# Patient Record
Sex: Female | Born: 1940 | Race: Black or African American | Hispanic: No | Marital: Married | Smoking: Current every day smoker
Health system: Southern US, Community
[De-identification: ages and names within clinical notes are randomized; demographics above are authoritative.]

---

## 2013-04-23 DIAGNOSIS — I10 Essential (primary) hypertension: Secondary | ICD-10-CM | POA: Diagnosis present

## 2013-10-05 DIAGNOSIS — E790 Hyperuricemia without signs of inflammatory arthritis and tophaceous disease: Secondary | ICD-10-CM | POA: Insufficient documentation

## 2013-10-05 DIAGNOSIS — D61818 Other pancytopenia: Secondary | ICD-10-CM | POA: Insufficient documentation

## 2018-12-04 ENCOUNTER — Other Ambulatory Visit: Payer: Self-pay

## 2018-12-04 ENCOUNTER — Emergency Department (HOSPITAL_BASED_OUTPATIENT_CLINIC_OR_DEPARTMENT_OTHER)
Admission: EM | Admit: 2018-12-04 | Discharge: 2018-12-04 | Disposition: A | Discharge status: Home / Self Care | Payer: Medicare PPO | Attending: Emergency Medicine | Admitting: Emergency Medicine

## 2018-12-04 ENCOUNTER — Emergency Department (HOSPITAL_BASED_OUTPATIENT_CLINIC_OR_DEPARTMENT_OTHER): Payer: Medicare PPO

## 2018-12-04 DIAGNOSIS — R531 Weakness: Secondary | ICD-10-CM

## 2018-12-04 DIAGNOSIS — E860 Dehydration: Secondary | ICD-10-CM | POA: Insufficient documentation

## 2018-12-04 DIAGNOSIS — N189 Chronic kidney disease, unspecified: Secondary | ICD-10-CM | POA: Diagnosis not present

## 2018-12-04 DIAGNOSIS — M6281 Muscle weakness (generalized): Secondary | ICD-10-CM | POA: Diagnosis not present

## 2018-12-04 DIAGNOSIS — D61818 Other pancytopenia: Secondary | ICD-10-CM | POA: Diagnosis not present

## 2018-12-04 LAB — URINALYSIS, ROUTINE W REFLEX MICROSCOPIC
Glucose, UA: NEGATIVE mg/dL
Hgb urine dipstick: NEGATIVE
Ketones, ur: 15 mg/dL — AB
Leukocytes,Ua: NEGATIVE
Nitrite: NEGATIVE
Protein, ur: NEGATIVE mg/dL
Specific Gravity, Urine: 1.025 (ref 1.005–1.030)
pH: 5.5 (ref 5.0–8.0)

## 2018-12-04 LAB — CBC WITH DIFFERENTIAL/PLATELET
Abs Immature Granulocytes: 0.02 10*3/uL (ref 0.00–0.07)
Basophils Absolute: 0 10*3/uL (ref 0.0–0.1)
Basophils Relative: 0 %
Eosinophils Absolute: 0 10*3/uL (ref 0.0–0.5)
Eosinophils Relative: 0 %
HCT: 23.1 % — ABNORMAL LOW (ref 36.0–46.0)
Hemoglobin: 8.4 g/dL — ABNORMAL LOW (ref 12.0–15.0)
Immature Granulocytes: 1 %
Lymphocytes Relative: 14 %
Lymphs Abs: 0.5 10*3/uL — ABNORMAL LOW (ref 0.7–4.0)
MCH: 36.8 pg — ABNORMAL HIGH (ref 26.0–34.0)
MCHC: 36.4 g/dL — ABNORMAL HIGH (ref 30.0–36.0)
MCV: 101.3 fL — ABNORMAL HIGH (ref 80.0–100.0)
Monocytes Absolute: 0.2 10*3/uL (ref 0.1–1.0)
Monocytes Relative: 4 %
Neutro Abs: 3 10*3/uL (ref 1.7–7.7)
Neutrophils Relative %: 81 %
Platelets: 81 10*3/uL — ABNORMAL LOW (ref 150–400)
RBC: 2.28 MIL/uL — ABNORMAL LOW (ref 3.87–5.11)
RDW: 15.3 % (ref 11.5–15.5)
WBC: 3.7 10*3/uL — ABNORMAL LOW (ref 4.0–10.5)
nRBC: 0 % (ref 0.0–0.2)

## 2018-12-04 LAB — COMPREHENSIVE METABOLIC PANEL
ALT: 48 U/L — ABNORMAL HIGH (ref 0–44)
AST: 109 U/L — ABNORMAL HIGH (ref 15–41)
Albumin: 3.2 g/dL — ABNORMAL LOW (ref 3.5–5.0)
Alkaline Phosphatase: 115 U/L (ref 38–126)
Anion gap: 17 — ABNORMAL HIGH (ref 5–15)
BUN: 15 mg/dL (ref 8–23)
CO2: 17 mmol/L — ABNORMAL LOW (ref 22–32)
Calcium: 8.9 mg/dL (ref 8.9–10.3)
Chloride: 98 mmol/L (ref 98–111)
Creatinine, Ser: 1.34 mg/dL — ABNORMAL HIGH (ref 0.44–1.00)
GFR calc Af Amer: 44 mL/min — ABNORMAL LOW (ref >60)
GFR calc non Af Amer: 38 mL/min — ABNORMAL LOW (ref >60)
Glucose, Bld: 132 mg/dL — ABNORMAL HIGH (ref 70–99)
Potassium: 4 mmol/L (ref 3.5–5.1)
Sodium: 132 mmol/L — ABNORMAL LOW (ref 135–145)
Total Bilirubin: 1.7 mg/dL — ABNORMAL HIGH (ref 0.3–1.2)
Total Protein: 7.6 g/dL (ref 6.5–8.1)

## 2018-12-04 LAB — OCCULT BLOOD X 1 CARD TO LAB, STOOL: Fecal Occult Bld: NEGATIVE

## 2018-12-04 LAB — MAGNESIUM: Magnesium: 1.9 mg/dL (ref 1.7–2.4)

## 2018-12-04 LAB — CBG MONITORING, ED: Glucose-Capillary: 114 mg/dL — ABNORMAL HIGH (ref 70–99)

## 2018-12-04 LAB — ETHANOL: Alcohol, Ethyl (B): <10 mg/dL (ref <10)

## 2018-12-04 MED ORDER — VITAMIN B-1 100 MG PO TABS: 100.0000 mg | ORAL_TABLET | Freq: Every day | ORAL | 1 refills | Status: AC

## 2018-12-04 MED ORDER — FOLIC ACID 1 MG PO TABS: 1.0000 mg | ORAL_TABLET | Freq: Every day | ORAL | 1 refills | Status: AC

## 2018-12-04 MED ORDER — LACTATED RINGERS IV BOLUS
1000.0000 mL | Freq: Once | INTRAVENOUS | Status: AC
  Administered 2018-12-04: 20:00:00 1000 mL via INTRAVENOUS

## 2018-12-04 NOTE — ED Provider Notes (Signed)
[facility] EMERGENCY DEPARTMENT Provider Note   CSN: [id number] Arrival date & time: 12/04/18  1743     History   Chief Complaint Chief Complaint  Patient presents with   Weakness    HPI Kathleen Dorsey is a 78 y.o. female.     Patient is a 78 year old female with a history of chronic kidney disease, prolonged QT and per family possible issue with her blood who is presenting today with her daughter for worsening symptoms of generalized weakness, falls, poor p.o. intake and general decline.  Daughter states this is been ongoing for months but is been worsening in the last 1 month.  Daughter states that her coloring looks off and she is just not the same as she used to be.  Patient states that last fall she had was several weeks ago and her legs gave out and she fell to her right side.  Patient states that she has noticed some shortness of breath but cannot specify the timeframe.  She denies any chest pain, diarrhea, abdominal pain but daughter states that she is constantly spitting her saliva and patient states that she has no appetite.  She has had an occasional cough but denies cold-like symptoms.  She denies any urinary issues or change in bowel movements.  She has had no recent change in her medications.  Daughter states that she has been going to her doctor's appointment with [facility] medical and her husband accompanies her but daughter is not sure what they have been telling the doctors or exactly what is going on.  Daughter also states that her mom has had issues with drinking for a long period of time and she seems to be drinking more wine in the last 6 months than she had been prior.  In the past when the patient has been confronted about this she states that she does not want to quit and she will only quit when she is ready.  Patient does not take any anticoagulation and does state that she is Jehovah's Witness and would not accept blood products.  The history is provided by  the patient and a relative.  Weakness Severity:  Moderate Onset quality:  Gradual Duration: weeks to months. Timing:  Constant Progression:  Worsening Chronicity:  New Context: alcohol use and dehydration   Context: not change in medication and not recent infection   Relieved by:  None tried Worsened by:  Activity Ineffective treatments:  None tried Associated symptoms: difficulty walking, falls and shortness of breath   Associated symptoms: no abdominal pain, no chest pain, no cough, no diarrhea, no dysuria, no fever, no foul-smelling urine, no loss of consciousness, no nausea, no stroke symptoms, no urgency, no vision change and no vomiting     No past medical history on file.  There are no active problems to display for this patient.      OB History   No obstetric history on file.      Home Medications    Prior to Admission medications   Medication Sig Start Date End Date Taking? Authorizing Provider  folic acid (FOLVITE) 1 MG tablet Take 1 tablet (1 mg total) by mouth daily. 12/04/18   Gwyneth Sprout, MD  thiamine (VITAMIN B-1) 100 MG tablet Take 1 tablet (100 mg total) by mouth daily. 12/04/18   Gwyneth Sprout, MD    Family History No family history on file.  Social History Social History   Tobacco Use   Smoking status: Not on file  Substance  Use Topics   Alcohol use: Not on file   Drug use: Not on file     Allergies   Nitrous oxide, Iodinated diagnostic agents, and Penicillins   Review of Systems Review of Systems  Constitutional: Negative for fever.  Respiratory: Positive for shortness of breath. Negative for cough.   Cardiovascular: Negative for chest pain.  Gastrointestinal: Negative for abdominal pain, diarrhea, nausea and vomiting.  Genitourinary: Negative for dysuria and urgency.  Musculoskeletal: Positive for falls.  Neurological: Positive for weakness. Negative for loss of consciousness.  All other systems reviewed and are  negative.    Physical Exam Updated Vital Signs BP (!) 99/59    Pulse 100    Temp 97.7 F (36.5 C) (Oral)    Resp 19    Wt 59.9 kg    SpO2 100%   Physical Exam Vitals signs and nursing note reviewed.  Constitutional:      General: She is not in acute distress.    Appearance: She is well-developed and underweight.  HENT:     Head: Normocephalic and atraumatic.     Mouth/Throat:     Mouth: Mucous membranes are moist.     Comments: Multiple bad teeth but no dental tenderness Eyes:     Extraocular Movements: Extraocular movements intact.     Pupils: Pupils are equal, round, and reactive to light.     Comments: Pale conjunctiva  Neck:     Musculoskeletal: Normal range of motion.  Cardiovascular:     Rate and Rhythm: Regular rhythm. Tachycardia present.     Pulses: Normal pulses.     Heart sounds: Murmur present. No friction rub.  Pulmonary:     Effort: Pulmonary effort is normal.     Breath sounds: Normal breath sounds. No wheezing or rales.  Abdominal:     General: Bowel sounds are normal. There is no distension.     Palpations: Abdomen is soft.     Tenderness: There is no abdominal tenderness. There is no guarding or rebound.  Musculoskeletal: Normal range of motion.        General: No tenderness.     Comments: No edema  Skin:    General: Skin is warm and dry.     Coloration: Skin is pale.     Findings: No rash.  Neurological:     Mental Status: She is alert and oriented to person, place, and time. Mental status is at baseline.     Cranial Nerves: No cranial nerve deficit.  Psychiatric:        Mood and Affect: Mood normal.        Behavior: Behavior normal.        Thought Content: Thought content normal.      ED Treatments / Results  Labs (all labs ordered are listed, but only abnormal results are displayed) Labs Reviewed  CBC WITH DIFFERENTIAL/PLATELET - Abnormal; Notable for the following components:      Result Value   WBC 3.7 (*)    RBC 2.28 (*)     Hemoglobin 8.4 (*)    HCT 23.1 (*)    MCV 101.3 (*)    MCH 36.8 (*)    MCHC 36.4 (*)    Platelets 81 (*)    Lymphs Abs 0.5 (*)    All other components within normal limits  COMPREHENSIVE METABOLIC PANEL - Abnormal; Notable for the following components:   Sodium 132 (*)    CO2 17 (*)    Glucose, Bld 132 (*)  Creatinine, Ser 1.34 (*)    Albumin 3.2 (*)    AST 109 (*)    ALT 48 (*)    Total Bilirubin 1.7 (*)    GFR calc non Af Amer 38 (*)    GFR calc Af Amer 44 (*)    Anion gap 17 (*)    All other components within normal limits  URINALYSIS, ROUTINE W REFLEX MICROSCOPIC - Abnormal; Notable for the following components:   Bilirubin Urine MODERATE (*)    Ketones, ur 15 (*)    All other components within normal limits  CBG MONITORING, ED - Abnormal; Notable for the following components:   Glucose-Capillary 114 (*)    All other components within normal limits  MAGNESIUM  ETHANOL  OCCULT BLOOD X 1 CARD TO LAB, STOOL  VITAMIN B12  FOLATE  IRON AND TIBC  FERRITIN  RETICULOCYTES    EKG EKG Interpretation  Date/Time:  Friday December 04 2018 18:30:50 EDT Ventricular Rate:  103 PR Interval:    QRS Duration: 120 QT Interval:  450 QTC Calculation: 590 R Axis:   19 Text Interpretation:  Sinus tachycardia Atrial premature complexes Nonspecific intraventricular conduction delay Borderline repolarization abnormality Baseline wander in lead(s) III aVL No previous tracing Confirmed by Gwyneth Sproutlunkett, Hernandez Losasso ([id number]) on 12/04/2018 6:50:44 PM   Radiology Dg Chest Portable 1 View  Result Date: 12/04/2018 CLINICAL DATA:  Weakness. EXAM: PORTABLE CHEST 1 VIEW COMPARISON:  Report from chest CT 10/30/2017 FINDINGS: Low lung volumes.The cardiomediastinal contours are normal. The lungs are clear. Pulmonary vasculature is normal. No consolidation, pleural effusion, or pneumothorax. No acute osseous abnormalities are seen. IMPRESSION: Low lung volumes without acute abnormality. Electronically  Signed   By: Narda RutherfordMelanie  Sanford M.D.   On: 12/04/2018 19:01    Procedures Procedures (including critical care time)  Medications Ordered in ED Medications  lactated ringers bolus 1,000 mL ( Intravenous Stopped 12/04/18 2136)     Initial Impression / Assessment and Plan / ED Course  I have reviewed the triage vital signs and the nursing notes.  Pertinent labs & imaging results that were available during my care of the patient were reviewed by me and considered in my medical decision making (see chart for details).        Elderly female presenting with her family today for months of worsening generalized weakness, occasional falls, poor nutrition and ongoing alcohol use.  On exam patient is in no acute distress but does appear underweight.  She does answer some questions and appears to be awake and alert but lets her daughter do most of the talking.  Seems that patient has been going to her doctor's appointments with her husband but it is unclear how much information they are providing to her physicians.  Also the daughters are not sure what they are being told.  Patient states the last fall she had was several weeks ago but she still having some rib pain from the fall.  Patient symptoms are concerning for dehydration, malnutrition, potential electrolyte abnormality given the report of alcohol use versus anemia.  Patient's alcohol level is less than 10, UA is within normal limits, CBC with evidence of pancytopenia with a white cell count of 3.7 and a hemoglobin of 8.4 and a platelet count of 80.  Last recorded hemoglobin in our system or care everywhere was from 1 year ago and at that time patient's hemoglobin was between 11 and 12 consistently for years.  Patient's Hemoccult today was negative and unclear why patient has  pancytopenia.  This could be the result of what is causing her to feel generally weak and short of breath.  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  Given patient's symptoms have seemed to be more of a gradual decline feel that the anemia is probably also been gradual on onset and not acute.  Discussed with the patient's daughter the importance of following up with PCP for further evaluation of the pancytopenia.  Whether this is myeloproliferative disease, other bone marrow suppression, iron deficiency anemia or related to poor nutrition she will need further work-up.  After IV fluids patient is feeling better.  She denies any history of syncope and she has had no recent immobilization or reason to have a PE.  Low suspicion for ACS at this time.  Patient's LFTs today are mildly elevated but is most likely due to her ongoing alcohol use.  Patient's initial anion gap is 17 and again is most A[place] related to dehydration.  Encourage family to return if patient's symptoms worsen over the weekend but if not follow-up with her doctor on Monday for ongoing evaluation and care.  Patient was sent home with folic acid and thiamine.  Final Clinical Impressions(s) / ED Diagnoses   Final diagnoses:  Pancytopenia (HCC)  Generalized weakness  Dehydration    ED Discharge Orders         Ordered    folic acid (FOLVITE) 1 MG tablet  Daily     12/04/18 2254    thiamine (VITAMIN B-1) 100 MG tablet  Daily     12/04/18 2254           Gwyneth Sprout, MD 12/05/18 0011

## 2018-12-04 NOTE — ED Triage Notes (Signed)
Pt not very verbal.  Family member state she has been getting weaker over last three weeks.  Pt not drinking and eating well.

## 2018-12-04 NOTE — ED Notes (Signed)
Blanket was given to patient by this EMT

## 2018-12-04 NOTE — Discharge Instructions (Signed)
Today it was found that your platelet count, red blood cell and white blood cell counts are low.  Kidney function, urine tests are within normal limits.  Liver enzymes were mildly elevated as well.  He did appear dehydrated today and were given IV fluids.  It will be very important for you to take the vitamins and follow-up with your doctor on Monday for more evaluation.  If symptoms worsen over the weekend please return for further work-up.

## 2018-12-04 NOTE — ED Notes (Signed)
Increased weakness, decreased po intake  Not very verbal  Over past week

## 2018-12-05 LAB — FERRITIN: Ferritin: 1491 ng/mL — ABNORMAL HIGH (ref 11–307)

## 2018-12-05 LAB — VITAMIN B12: Vitamin B-12: 585 pg/mL (ref 180–914)

## 2018-12-05 LAB — RETICULOCYTES
Immature Retic Fract: 29.5 % — ABNORMAL HIGH (ref 2.3–15.9)
RBC.: 1.83 MIL/uL — ABNORMAL LOW (ref 3.87–5.11)
Retic Count, Absolute: 40.6 10*3/uL (ref 19.0–186.0)
Retic Ct Pct: 2.2 % (ref 0.4–3.1)

## 2018-12-05 LAB — IRON AND TIBC: Iron: 85 ug/dL (ref 28–170)

## 2018-12-05 LAB — FOLATE: Folate: 6.5 ng/mL (ref >5.9)

## 2019-07-12 DIAGNOSIS — R413 Other amnesia: Secondary | ICD-10-CM | POA: Insufficient documentation

## 2020-04-08 IMAGING — DX DG CHEST 1V PORT
1 series · 1 of 1 positions shown · non-contrast
Comparison: Report from chest CT 10/30/2017

CLINICAL DATA: Weakness.

EXAM:
PORTABLE CHEST 1 VIEW

[chest ap]
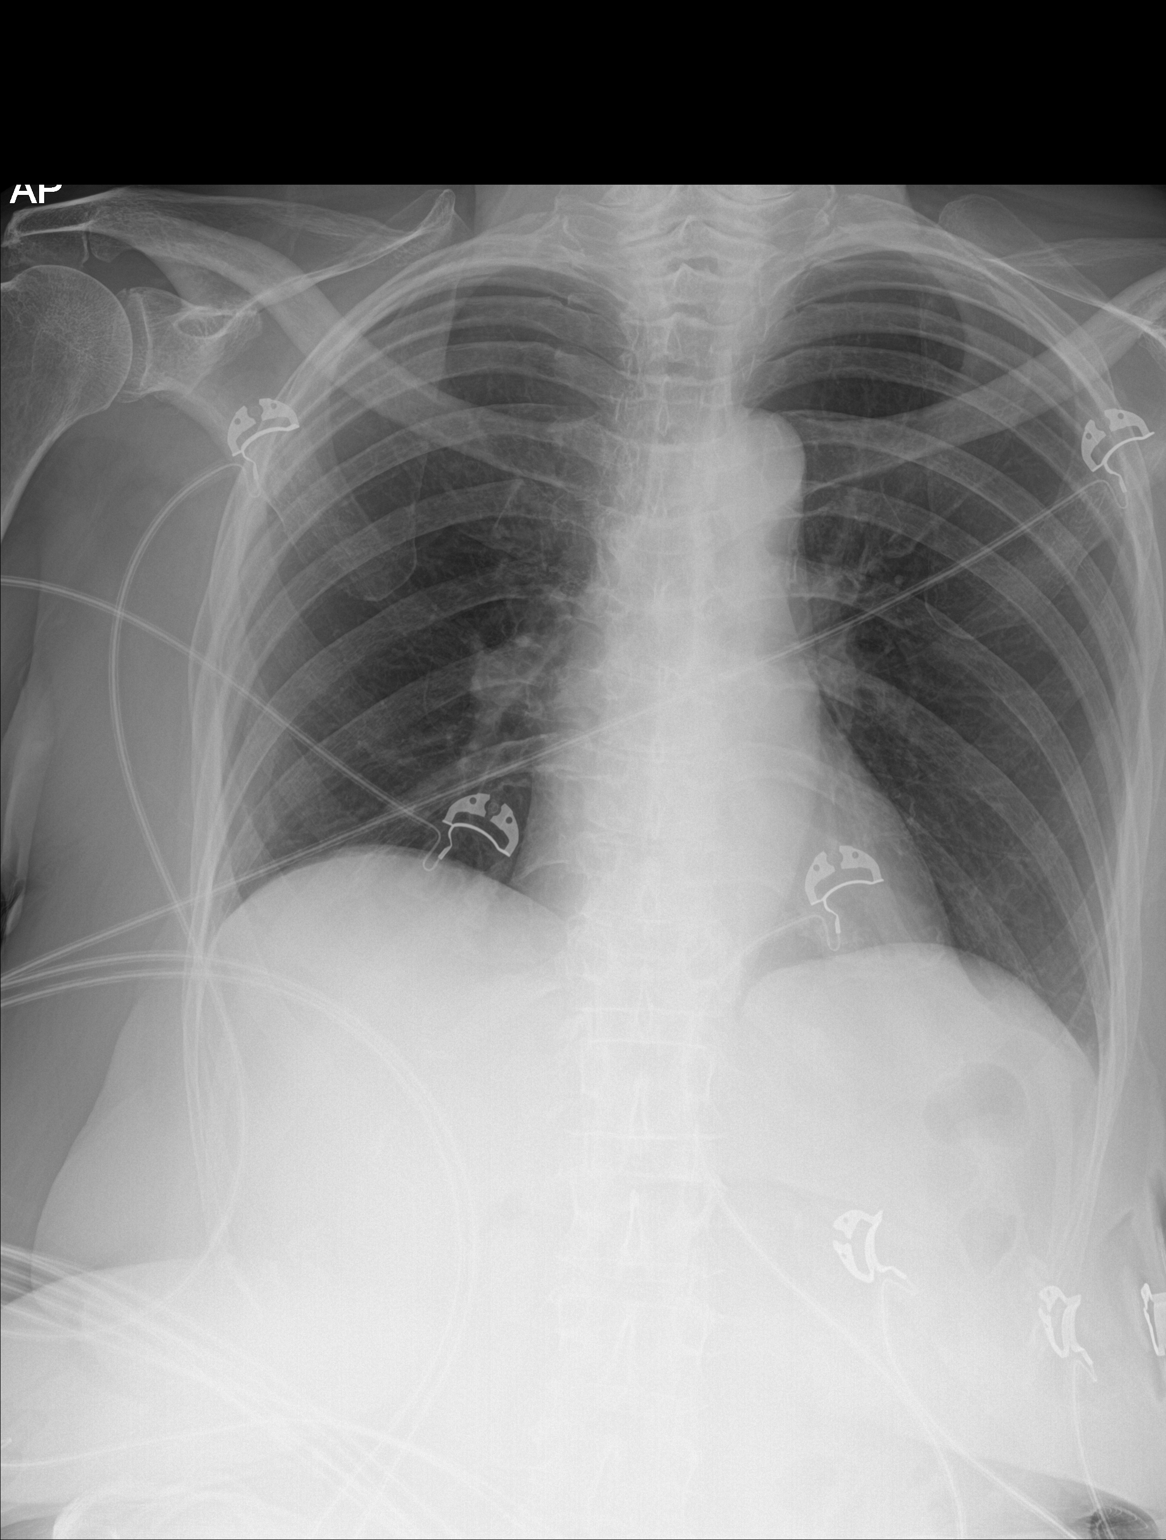

[1 of 1 positions shown; findings below may reference images not displayed]

FINDINGS: Low lung volumes.The cardiomediastinal contours are normal. The
lungs are clear. Pulmonary vasculature is normal. No consolidation,
pleural effusion, or pneumothorax. No acute osseous abnormalities
are seen.
IMPRESSION: Low lung volumes without acute abnormality.

## 2021-11-05 DIAGNOSIS — D696 Thrombocytopenia, unspecified: Secondary | ICD-10-CM | POA: Insufficient documentation

## 2022-09-10 DIAGNOSIS — R627 Adult failure to thrive: Secondary | ICD-10-CM | POA: Insufficient documentation

## 2022-11-11 ENCOUNTER — Observation Stay (HOSPITAL_COMMUNITY)
Admission: EM | Admit: 2022-11-11 | Discharge: 2022-11-17 | Disposition: A | Discharge status: Home / Self Care | Payer: Medicare PPO | Attending: Student | Admitting: Student

## 2022-11-11 ENCOUNTER — Encounter (HOSPITAL_COMMUNITY): Payer: Self-pay | Admitting: Family Medicine

## 2022-11-11 ENCOUNTER — Emergency Department (HOSPITAL_COMMUNITY): Payer: Medicare PPO

## 2022-11-11 ENCOUNTER — Other Ambulatory Visit: Payer: Self-pay

## 2022-11-11 DIAGNOSIS — K922 Gastrointestinal hemorrhage, unspecified: Secondary | ICD-10-CM | POA: Diagnosis not present

## 2022-11-11 DIAGNOSIS — I10 Essential (primary) hypertension: Secondary | ICD-10-CM | POA: Diagnosis present

## 2022-11-11 DIAGNOSIS — N939 Abnormal uterine and vaginal bleeding, unspecified: Secondary | ICD-10-CM | POA: Diagnosis present

## 2022-11-11 DIAGNOSIS — Z7901 Long term (current) use of anticoagulants: Secondary | ICD-10-CM | POA: Insufficient documentation

## 2022-11-11 DIAGNOSIS — E876 Hypokalemia: Secondary | ICD-10-CM | POA: Insufficient documentation

## 2022-11-11 DIAGNOSIS — E860 Dehydration: Secondary | ICD-10-CM

## 2022-11-11 DIAGNOSIS — D62 Acute posthemorrhagic anemia: Secondary | ICD-10-CM | POA: Diagnosis present

## 2022-11-11 DIAGNOSIS — F0390 Unspecified dementia without behavioral disturbance: Secondary | ICD-10-CM | POA: Diagnosis present

## 2022-11-11 DIAGNOSIS — Z79899 Other long term (current) drug therapy: Secondary | ICD-10-CM | POA: Diagnosis not present

## 2022-11-11 DIAGNOSIS — M109 Gout, unspecified: Secondary | ICD-10-CM

## 2022-11-11 DIAGNOSIS — D509 Iron deficiency anemia, unspecified: Secondary | ICD-10-CM

## 2022-11-11 DIAGNOSIS — F17210 Nicotine dependence, cigarettes, uncomplicated: Secondary | ICD-10-CM | POA: Insufficient documentation

## 2022-11-11 HISTORY — DX: Gout, unspecified: M10.9

## 2022-11-11 HISTORY — DX: Essential (primary) hypertension: I10

## 2022-11-11 HISTORY — DX: Unspecified dementia, unspecified severity, without behavioral disturbance, psychotic disturbance, mood disturbance, and anxiety: F03.90

## 2022-11-11 LAB — COMPREHENSIVE METABOLIC PANEL
ALT: 12 U/L (ref 0–44)
AST: 19 U/L (ref 15–41)
Albumin: 2.8 g/dL — ABNORMAL LOW (ref 3.5–5.0)
Alkaline Phosphatase: 58 U/L (ref 38–126)
Anion gap: 9 (ref 5–15)
BUN: 57 mg/dL — ABNORMAL HIGH (ref 8–23)
CO2: 19 mmol/L — ABNORMAL LOW (ref 22–32)
Calcium: 8.1 mg/dL — ABNORMAL LOW (ref 8.9–10.3)
Chloride: 114 mmol/L — ABNORMAL HIGH (ref 98–111)
Creatinine, Ser: 0.87 mg/dL (ref 0.44–1.00)
GFR, Estimated: >60 mL/min (ref >60)
Glucose, Bld: 117 mg/dL — ABNORMAL HIGH (ref 70–99)
Potassium: 3.3 mmol/L — ABNORMAL LOW (ref 3.5–5.1)
Sodium: 142 mmol/L (ref 135–145)
Total Bilirubin: 0.4 mg/dL (ref 0.3–1.2)
Total Protein: 7.6 g/dL (ref 6.5–8.1)

## 2022-11-11 LAB — CBC
HCT: 17.6 % — ABNORMAL LOW (ref 36.0–46.0)
Hemoglobin: 5.5 g/dL — CL (ref 12.0–15.0)
MCH: 29.1 pg (ref 26.0–34.0)
MCHC: 31.3 g/dL (ref 30.0–36.0)
MCV: 93.1 fL (ref 80.0–100.0)
Platelets: 275 10*3/uL (ref 150–400)
RBC: 1.89 MIL/uL — ABNORMAL LOW (ref 3.87–5.11)
RDW: 17.9 % — ABNORMAL HIGH (ref 11.5–15.5)
WBC: 6.2 10*3/uL (ref 4.0–10.5)
nRBC: 0 % (ref 0.0–0.2)

## 2022-11-11 LAB — POC OCCULT BLOOD, ED: Fecal Occult Bld: POSITIVE — AB

## 2022-11-11 MED ORDER — ACETAMINOPHEN 325 MG PO TABS
650.0000 mg | ORAL_TABLET | Freq: Four times a day (QID) | ORAL | Status: DC | PRN
  Administered 2022-11-11 – 2022-11-15 (×3): 650 mg via ORAL
  Filled 2022-11-11 (×4): qty 2

## 2022-11-11 MED ORDER — ONDANSETRON HCL 4 MG PO TABS: 4.0000 mg | ORAL_TABLET | Freq: Four times a day (QID) | ORAL | Status: DC | PRN

## 2022-11-11 MED ORDER — ONDANSETRON HCL 4 MG/2ML IJ SOLN: 4.0000 mg | Freq: Four times a day (QID) | INTRAMUSCULAR | Status: DC | PRN

## 2022-11-11 MED ORDER — SODIUM CHLORIDE 0.9 % IV SOLN: INTRAVENOUS | Status: AC

## 2022-11-11 MED ORDER — POTASSIUM CHLORIDE 10 MEQ/100ML IV SOLN
10.0000 meq | INTRAVENOUS | Status: AC
  Administered 2022-11-11 (×2): 10 meq via INTRAVENOUS
  Filled 2022-11-11 (×2): qty 100

## 2022-11-11 MED ORDER — PANTOPRAZOLE INFUSION (NEW) - SIMPLE MED
8.0000 mg/h | INTRAVENOUS | Status: AC
  Administered 2022-11-11 – 2022-11-14 (×8): 8 mg/h via INTRAVENOUS
  Filled 2022-11-11 (×8): qty 80

## 2022-11-11 MED ORDER — SODIUM CHLORIDE 0.9% FLUSH
3.0000 mL | Freq: Two times a day (BID) | INTRAVENOUS | Status: DC
  Administered 2022-11-11 – 2022-11-17 (×7): 3 mL via INTRAVENOUS

## 2022-11-11 MED ORDER — PANTOPRAZOLE 80MG IVPB - SIMPLE MED
80.0000 mg | Freq: Once | INTRAVENOUS | Status: AC
  Administered 2022-11-11: 80 mg via INTRAVENOUS
  Filled 2022-11-11: qty 80

## 2022-11-11 MED ORDER — PANTOPRAZOLE SODIUM 40 MG IV SOLR
40.0000 mg | Freq: Two times a day (BID) | INTRAVENOUS | Status: DC
  Administered 2022-11-15: 40 mg via INTRAVENOUS
  Filled 2022-11-11: qty 10

## 2022-11-11 MED ORDER — ACETAMINOPHEN 650 MG RE SUPP: 650.0000 mg | Freq: Four times a day (QID) | RECTAL | Status: DC | PRN

## 2022-11-11 NOTE — ED Provider Notes (Signed)
I saw and evaluated the patient, reviewed the resident's note and I agree with the findings and plan.      This is a 82 year old female presents from facility due to episode of hypotension as well as vaginal bleeding.  Spoke with EMS who states that when they got there patient's blood pressure was stable.  Patient has baseline for dementia.  On exam with chaperone present no evidence of gross vaginal bleeding.  Will order labs as well as pelvic ultrasound   Lorre Nick, MD 11/11/22 [phone]

## 2022-11-11 NOTE — H&P (Addendum)
History and Physical    Raiden Letson WUX:[id number] DOB: [birth year] DOA: 11/11/2022  PCP: Patient, No Pcp Per   Patient coming from: SNF  Chief Complaint: Bleeding, low BP   HPI: Allyah Lariccia is a pleasant 82 y.o. female with medical history significant for hypertension, gout, dementia, and refusal of blood products due to religious believes who presents from her nursing facility for evaluation of bleeding and hypotension.  There was concern raised her facility that she may be having vaginal bleeding, was noted to have a bloody bowel movement today, and reportedly had a blood pressure of 87/54.  EMS was called, found the blood pressure to be stable, and brought the patient into the ED.  SNF personnel note that the patient's Eliquis has been on hold since 11/08/2022.  SNF paperwork includes patient's diagnoses, but it is unclear what the indication for Eliquis was.  The patient's daughter had not heard about the bleeding and is unsure why the patient had been on a blood thinner. The patient's daughter confirms that the patient will not accept blood products.   ED Course: Upon arrival to the ED, patient is found to be afebrile and saturating 100% on room air with heart rate 99 and blood pressure 131/72.  Labs are most notable for hemoglobin 5.5, potassium 3.3, BUN 57, and positive FOBT.  GI (Dr. Dulce Sellar) was consulted by the ED physician and hospitalists were asked to admit.  Review of Systems:  ROS limited by patient's clinical condition.  Past Medical History:  Diagnosis Date   Dementia (HCC) 11/11/2022   Gout 11/11/2022   Hypertension 11/11/2022    History reviewed. No pertinent surgical history.  Social History:   has no history on file for tobacco use, alcohol use, and drug use.  Allergies  Allergen Reactions   Nitrous Oxide    Iodinated Contrast Media Itching and Rash   Penicillins Rash    History reviewed. No pertinent family history.   Prior to Admission medications    Medication Sig Start Date End Date Taking? Authorizing Provider  folic acid (FOLVITE) 1 MG tablet Take 1 tablet (1 mg total) by mouth daily. 12/04/18   Gwyneth Sprout, MD  thiamine (VITAMIN B-1) 100 MG tablet Take 1 tablet (100 mg total) by mouth daily. 12/04/18   Gwyneth Sprout, MD    Physical Exam: Vitals:   11/11/22 1522 11/11/22 1525 11/11/22 1527  BP: 131/72    Pulse:   99  Resp:   20  Temp: 98.6 F (37 C)    TempSrc: Oral    SpO2:   100%  Weight:  59 kg   Height:  5\' 3"  (1.6 m)     Constitutional: NAD, calm  Eyes: PERTLA, lids and conjunctivae normal ENMT: Mucous membranes are moist. Posterior pharynx clear of any exudate or lesions.   Neck: supple, no masses  Respiratory: no wheezing, no crackles. No accessory muscle use.  Cardiovascular: S1 & S2 heard, regular rate and rhythm. No extremity edema.   Abdomen: No distension, no tenderness, soft. Bowel sounds active.  Musculoskeletal: no clubbing / cyanosis. No joint deformity upper and lower extremities.   Skin: no significant rashes, lesions, ulcers. Warm, dry, well-perfused. Neurologic: CN 2-12 grossly intact. Moving all extremities. Alert, oriented to person, and knows that she is in the hospital.  Psychiatric: Pleasant. Cooperative.    Labs and Imaging on Admission: I have personally reviewed following labs and imaging studies  CBC: Recent Labs  Lab 11/11/22 1619  WBC 6.2  HGB  5.5*  HCT 17.6*  MCV 93.1  PLT 275   Basic Metabolic Panel: Recent Labs  Lab 11/11/22 1619  NA 142  K 3.3*  CL 114*  CO2 19*  GLUCOSE 117*  BUN 57*  CREATININE 0.87  CALCIUM 8.1*   GFR: Estimated Creatinine Clearance: 41.2 mL/min (by C-G formula based on SCr of 0.87 mg/dL). Liver Function Tests: Recent Labs  Lab 11/11/22 1619  AST 19  ALT 12  ALKPHOS 58  BILITOT 0.4  PROT 7.6  ALBUMIN 2.8*   No results for input(s): "LIPASE", "AMYLASE" in the last 168 hours. No results for input(s): "AMMONIA" in the last 168  hours. Coagulation Profile: No results for input(s): "INR", "PROTIME" in the last 168 hours. Cardiac Enzymes: No results for input(s): "CKTOTAL", "CKMB", "CKMBINDEX", "TROPONINI" in the last 168 hours. BNP (last 3 results) No results for input(s): "PROBNP" in the last 8760 hours. HbA1C: No results for input(s): "HGBA1C" in the last 72 hours. CBG: No results for input(s): "GLUCAP" in the last 168 hours. Lipid Profile: No results for input(s): "CHOL", "HDL", "LDLCALC", "TRIG", "CHOLHDL", "LDLDIRECT" in the last 72 hours. Thyroid Function Tests: No results for input(s): "TSH", "T4TOTAL", "FREET4", "T3FREE", "THYROIDAB" in the last 72 hours. Anemia Panel: No results for input(s): "VITAMINB12", "FOLATE", "FERRITIN", "TIBC", "IRON", "RETICCTPCT" in the last 72 hours. Urine analysis:    Component Value Date/Time   COLORURINE YELLOW 12/04/2018 2010   APPEARANCEUR CLEAR 12/04/2018 2010   LABSPEC 1.025 12/04/2018 2010   PHURINE 5.5 12/04/2018 2010   GLUCOSEU NEGATIVE 12/04/2018 2010   HGBUR NEGATIVE 12/04/2018 2010   BILIRUBINUR MODERATE (A) 12/04/2018 2010   KETONESUR 15 (A) 12/04/2018 2010   PROTEINUR NEGATIVE 12/04/2018 2010   NITRITE NEGATIVE 12/04/2018 2010   LEUKOCYTESUR NEGATIVE 12/04/2018 2010   Sepsis Labs: @LABRCNTIP (procalcitonin:4,lacticidven:4) )No results found for this or any previous visit (from the past 240 hour(s)).   Radiological Exams on Admission: US PELVIS (TRANSABDOMINAL ONLY)  Result Date: 11/11/2022 CLINICAL DATA:  Postmenopausal bleeding EXAM: TRANSABDOMINAL ULTRASOUND OF PELVIS TECHNIQUE: Transabdominal ultrasound examination of the pelvis was performed including evaluation of the uterus, ovaries, adnexal regions, and pelvic cul-de-sac. COMPARISON:  CT abdomen and pelvis 11/02/2021 FINDINGS: Uterus Hysterectomy. Right ovary Not visualized. Left ovary Not visualized. Other findings: No abnormal free fluid. Unremarkable appearance of the bladder. IMPRESSION: The  uterus is surgically absent.  The ovaries were not visualized. Electronically Signed   By: Minerva Fester M.D.   On: 11/11/2022 18:05    EKG: Independently reviewed. Sinus rhythm.   Assessment/Plan   1. Acute GI bleeding; acute on chronic anemia  - Initial Hgb 5.5 (9.7 in June '24), BP stable in ED, pt is Jehovah's Witness and will not accept blood transfusion  - BUN is up to 57 with normal creatinine and stool was dark per ED physician  - Start IV PPI, limit blood draws, follow-up GI recommendations    2. Hypertension  - Was reportedly hypotensive at her facility today  - Treat as-needed only for now -   3. Dementia  - Delirium precautions   4. Hypokalemia  - Replace    DVT prophylaxis: SCDs  Code Status: Full  Level of Care: Level of care: Progressive Family Communication: Daughter [name]) updated by phone  Disposition Plan:  Patient is from: SNF  Anticipated d/c is to: SNF  Anticipated d/c date is: 11/14/22  Patient currently: Pending GI consultation, stable H&H Consults called: GI  Admission status: Inpatient     Safir Michalec S Louis Gaw,  MD [facility]  11/11/2022, 7:03 PM

## 2022-11-11 NOTE — ED Provider Notes (Signed)
[facility] EMERGENCY DEPARTMENT AT [facility] Provider Note   CSN: [id number] Arrival date & time: 11/11/22  1446     History  Chief Complaint  Patient presents with   Vaginal Bleeding   Hypotension    Kathleen Dorsey is a 82 y.o. female.  HPI   82 year old female,hx of CKD,dementia, who was brought in by EMS from her nursing home due to concerns for hypotension and vaginal bleeding.Her nursing facility [facility] ) reported a Hgb of 6.8 and a blood pressure of 87/54. Per the staff at [facility], patient also a bloody bowel movement this morning.  On exam, patient seems to be somewhat confused with waxing and waning mentation. She is oriented to self ,person but not to place. Blood pressure on presentation was 133/89 . She denies any palpations, dizziness or chest pain at this time. Patient  denies any recent fall.  Patient is on Eliquis that was discontinued on August 30th 2024   Home Medications Prior to Admission medications   Medication Sig Start Date End Date Taking? Authorizing Provider  folic acid (FOLVITE) 1 MG tablet Take 1 tablet (1 mg total) by mouth daily. 12/04/18   Gwyneth Sprout, MD  thiamine (VITAMIN B-1) 100 MG tablet Take 1 tablet (100 mg total) by mouth daily. 12/04/18   Gwyneth Sprout, MD      Allergies    Nitrous oxide, Iodinated contrast media, and Penicillins    Review of Systems   Review of Systems  Physical Exam Updated Vital Signs BP 131/72 (BP Location: Right Arm)   Pulse 99   Temp 98.6 F (37 C) (Oral)   Resp 20   Ht 5\' 3"  (1.6 m)   Wt 59 kg   SpO2 100%   BMI 23.03 kg/m   Physical Exam Cardiovascular:     Rate and Rhythm: Normal rate and regular rhythm.  Pulmonary:     Effort: Pulmonary effort is normal.     Breath sounds: Normal breath sounds.  Abdominal:     General: Abdomen is flat.     Palpations: Abdomen is soft.  Genitourinary:    Comments: Dark stool on rectal exam  No signs of vaginal bleeding  One  anal skin tag noted on rectal exam Skin:    General: Skin is warm and dry.  Neurological:     Mental Status: Mental status is at baseline.     ED Results / Procedures / Treatments   Labs (all labs ordered are listed, but only abnormal results are displayed) Labs Reviewed  CBC  COMPREHENSIVE METABOLIC PANEL  OCCULT BLOOD X 1 CARD TO LAB, STOOL    EKG None  Radiology No results found.  Procedures Procedures    Medications Ordered in ED Medications - No data to display  ED Course/ Medical Decision Making/ A&P Clinical Course as of 11/11/22 1718  Mon Nov 11, 2022  1717 RBC(!): 1.89 [PA]    Clinical Course User Index [PA] Kathleen Lime, MD                                 Medical Decision Making Amount and/or Complexity of Data Reviewed External Data Reviewed: labs. Labs: ordered. Decision-making details documented in ED Course.    Details: CBC,CMP Radiology: ordered.    Details: Abdominal US  ECG/medicine tests: ordered and independent interpretation performed.    Details: EKG showed normal sinus with evidence of T Wave abnormality  This patient is a 82 y.o. female who presents to the ED for concern of hypotension and vaginal bleeding , this involves an extensive number of treatment options, and is a complaint that carries with it a high risk of complications and morbidity. The emergent differential diagnosis prior to evaluation includes, but is not limited to,  Anemia, dehydration in the setting poor PO intake, diverticulosis,or colon cancer  . This is not an exhaustive differential.   Additional history: Chart reviewed. Pertinent results include: CBC,CMP,FOBT  Physical Exam: Physical exam performed. The pertinent findings include: Melena on rectal exam   Lab Tests: I ordered, and personally interpreted labs.  The pertinent results include:  positive fecal occult blood ,Hypokalemic of 3.3 and  BUN of 57 and Hgb of 5.5    Disposition: After consideration  of the diagnostic results and the patients response to treatment, I feel that patients low hgb and hypotension is likely due to fecal blood loss. Her repeat Hgb is 5.5 with a positive occult blood test.  GI was consulted ,spoke with Dr Santina Evans MD who think the patient is not eligible for scope at this time due to the fact that patient is Jehovah's witness  and doesn't receive blood transfusions.I wonder id her Eliquis could be contributing to this bleeding. Her last dose of Eliquis was August 30 th 2024. Abdominal ultrasound showed absent uterus and her ovaries are not visualized. Will admit patient and treat her conservatively per GI   I discussed this case with my attending physician Dr. Freida Busman  who cosigned this note including patient's presenting symptoms, physical exam, and planned diagnostics and interventions. Attending physician stated agreement with plan or made changes to plan which were implemented.           Final Clinical Impression(s) / ED Diagnoses Final diagnoses:  None    Rx / DC Orders ED Discharge Orders     None         Kathleen Lime, MD 11/11/22 1610    Lorre Nick, MD 11/12/22 [phone]

## 2022-11-11 NOTE — ED Triage Notes (Signed)
Pt BIBA from [facility]. EMS called out for vaginal bleeding and hypotension. Facility reported taking pt off unknown blood thinner.  Pt gums pale, with hgb reported at 6.8  During triage pt unsure why they are at the hospital.  Pt bp 120/60 with EMS.  20 L FA, given 500 NS

## 2022-11-11 NOTE — ED Notes (Signed)
ED TO INPATIENT HANDOFF REPORT  ED Nurse Name and Phone #: [phone]   S Name/Age/Gender Kathleen Dorsey 82 y.o. female Room/Bed: WA02/WA02  Code Status   Code Status: Full Code  Home/SNF/Other Facility Patient oriented to: self, place, and situation Is this baseline? Yes   Triage Complete: Triage complete  Chief Complaint Acute GI bleeding [K92.2]  Triage Note Pt BIBA from [facility]. EMS called out for vaginal bleeding and hypotension. Facility reported taking pt off unknown blood thinner.  Pt gums pale, with hgb reported at 6.8  During triage pt unsure why they are at the hospital.  Pt bp 120/60 with EMS.  20 L FA, given 500 NS   Allergies Allergies  Allergen Reactions   Nitrous Oxide     unknown   Iodinated Contrast Media Itching and Rash   Penicillins Rash    Level of Care/Admitting Diagnosis ED Disposition     ED Disposition  Admit   Condition  --   Comment  Hospital Area: [facility] [[id number]]  Level of Care: Progressive [102]  Admit to Progressive based on following criteria: GI, ENDOCRINE disease patients with GI bleeding, acute liver failure or pancreatitis, stable with diabetic ketoacidosis or thyrotoxicosis (hypothyroid) state.  May admit patient to [facility] or [facility] if equivalent level of care is available:: Yes  Covid Evaluation: Asymptomatic - no recent exposure (last 10 days) testing not required  Diagnosis: Acute GI bleeding [098119]  Admitting Physician: Briscoe Deutscher [[id number]]  Attending Physician: Briscoe Deutscher [[id number]]  Certification:: I certify this patient will need inpatient services for at least 2 midnights  Expected Medical Readiness: 11/14/2022          B Medical/Surgery History Past Medical History:  Diagnosis Date   Dementia (HCC) 11/11/2022   Gout 11/11/2022   Hypertension 11/11/2022   History reviewed. No pertinent surgical history.   A IV Location/Drains/Wounds Patient  Lines/Drains/Airways Status     Active Line/Drains/Airways     Name Placement date Placement time Site Days   Peripheral IV 11/11/22 20 G 1" Anterior;Left Hand 11/11/22  1501  Hand  less than 1            Intake/Output Last 24 hours No intake or output data in the 24 hours ending 11/11/22 2001  Labs/Imaging Results for orders placed or performed during the hospital encounter of 11/11/22 (from the past 48 hour(s))  CBC     Status: Abnormal   Collection Time: 11/11/22  4:19 PM  Result Value Ref Range   WBC 6.2 4.0 - 10.5 K/uL   RBC 1.89 (L) 3.87 - 5.11 MIL/uL   Hemoglobin 5.5 (LL) 12.0 - 15.0 g/dL    Comment: REPEATED TO VERIFY THIS CRITICAL RESULT HAS VERIFIED AND BEEN CALLED TO D.GRAY, RN BY NATHAN THOMPSON ON 09 02 2024 AT 1659, AND HAS BEEN READ BACK. CRITICAL RESULT VERIFIED    HCT 17.6 (L) 36.0 - 46.0 %   MCV 93.1 80.0 - 100.0 fL   MCH 29.1 26.0 - 34.0 pg   MCHC 31.3 30.0 - 36.0 g/dL   RDW 57.8 (H) 46.9 - 62.9 %   Platelets 275 150 - 400 K/uL   nRBC 0.0 0.0 - 0.2 %    Comment: Performed at [facility], [address] [place], [place] [address]  Comprehensive metabolic panel     Status: Abnormal   Collection Time: 11/11/22  4:19 PM  Result Value Ref Range   Sodium 142 135 -  145 mmol/L   Potassium 3.3 (L) 3.5 - 5.1 mmol/L   Chloride 114 (H) 98 - 111 mmol/L   CO2 19 (L) 22 - 32 mmol/L   Glucose, Bld 117 (H) 70 - 99 mg/dL    Comment: Glucose reference range applies only to samples taken after fasting for at least 8 hours.   BUN 57 (H) 8 - 23 mg/dL   Creatinine, Ser 5.78 0.44 - 1.00 mg/dL   Calcium 8.1 (L) 8.9 - 10.3 mg/dL   Total Protein 7.6 6.5 - 8.1 g/dL   Albumin 2.8 (L) 3.5 - 5.0 g/dL   AST 19 15 - 41 U/L   ALT 12 0 - 44 U/L   Alkaline Phosphatase 58 38 - 126 U/L   Total Bilirubin 0.4 0.3 - 1.2 mg/dL   GFR, Estimated >46 >96 mL/min    Comment: (NOTE) Calculated using the CKD-EPI Creatinine Equation (2021)    Anion gap 9 5 - 15     Comment: Performed at [facility], [address] [place], [place] [address]  POC occult blood, ED     Status: Abnormal   Collection Time: 11/11/22  4:26 PM  Result Value Ref Range   Fecal Occult Bld POSITIVE (A) NEGATIVE   US PELVIS (TRANSABDOMINAL ONLY)  Result Date: 11/11/2022 CLINICAL DATA:  Postmenopausal bleeding EXAM: TRANSABDOMINAL ULTRASOUND OF PELVIS TECHNIQUE: Transabdominal ultrasound examination of the pelvis was performed including evaluation of the uterus, ovaries, adnexal regions, and pelvic cul-de-sac. COMPARISON:  CT abdomen and pelvis 11/02/2021 FINDINGS: Uterus Hysterectomy. Right ovary Not visualized. Left ovary Not visualized. Other findings: No abnormal free fluid. Unremarkable appearance of the bladder. 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  The ovaries were not visualized. Electronically Signed   By: Minerva Fester M.D.   On: 11/11/2022 18:05    Pending Labs Unresulted Labs (From admission, onward)     Start     Ordered   11/12/22 0500  Basic metabolic panel  Daily,   R      11/11/22 1901   11/12/22 0500  Magnesium  Tomorrow morning,   R        11/11/22 1901   11/12/22 0500  CBC  Daily,   R      11/11/22 1901   11/12/22 0500  Vitamin B12  (Anemia Panel (PNL))  Tomorrow morning,   R        11/11/22 1938   11/12/22 0500  Folate  (Anemia Panel (PNL))  Tomorrow morning,   R        11/11/22 1938   11/12/22 0500  Iron and TIBC  (Anemia Panel (PNL))  Tomorrow morning,   R        11/11/22 1938   11/12/22 0500  Ferritin  (Anemia Panel (PNL))  Tomorrow morning,   R        11/11/22 1938   11/12/22 0500  Reticulocytes  (Anemia Panel (PNL))  Tomorrow morning,   R        11/11/22 1938            Vitals/Pain Today's Vitals   11/11/22 1522 11/11/22 1525 11/11/22 1527 11/11/22 1948  BP: 131/72   131/61  Pulse:   99 87  Resp:   20 (!) 21  Temp: 98.6 F (37 C)   98.2 F (36.8 C)  TempSrc: Oral   Oral  SpO2:   100% 100%  Weight:  130 lb  (59 kg)    Height:  5\' 3"  (1.6  m)    PainSc:  4       Isolation Precautions No active isolations  Medications Medications  pantoprazole (PROTONIX) 80 mg /NS 100 mL IVPB (80 mg Intravenous New Bag/Given 11/11/22 1957)  pantoprozole (PROTONIX) 80 mg /NS 100 mL infusion (has no administration in time range)  pantoprazole (PROTONIX) injection 40 mg (has no administration in time range)  sodium chloride flush (NS) 0.9 % injection 3 mL (has no administration in time range)  potassium chloride 10 mEq in 100 mL IVPB (has no administration in time range)  acetaminophen (TYLENOL) tablet 650 mg (has no administration in time range)    Or  acetaminophen (TYLENOL) suppository 650 mg (has no administration in time range)  ondansetron (ZOFRAN) tablet 4 mg (has no administration in time range)    Or  ondansetron (ZOFRAN) injection 4 mg (has no administration in time range)  0.9 %  sodium chloride infusion ( Intravenous New Bag/Given 11/11/22 1956)    Mobility walks with device     Focused Assessments     R Recommendations: See Admitting Provider Note  Report given to:   Additional Notes:

## 2022-11-12 ENCOUNTER — Encounter (HOSPITAL_COMMUNITY): Payer: Self-pay | Admitting: Family Medicine

## 2022-11-12 DIAGNOSIS — F0390 Unspecified dementia without behavioral disturbance: Secondary | ICD-10-CM | POA: Diagnosis not present

## 2022-11-12 DIAGNOSIS — K922 Gastrointestinal hemorrhage, unspecified: Secondary | ICD-10-CM | POA: Diagnosis not present

## 2022-11-12 DIAGNOSIS — I10 Essential (primary) hypertension: Secondary | ICD-10-CM | POA: Diagnosis not present

## 2022-11-12 DIAGNOSIS — D62 Acute posthemorrhagic anemia: Secondary | ICD-10-CM | POA: Diagnosis not present

## 2022-11-12 LAB — IRON AND TIBC
Iron: 16 ug/dL — ABNORMAL LOW (ref 28–170)
Saturation Ratios: 9 % — ABNORMAL LOW (ref 10.4–31.8)
TIBC: 182 ug/dL — ABNORMAL LOW (ref 250–450)
UIBC: 166 ug/dL

## 2022-11-12 LAB — CBC
HCT: 13.9 % — ABNORMAL LOW (ref 36.0–46.0)
Hemoglobin: 4.3 g/dL — CL (ref 12.0–15.0)
MCH: 29.5 pg (ref 26.0–34.0)
MCHC: 30.9 g/dL (ref 30.0–36.0)
MCV: 95.2 fL (ref 80.0–100.0)
Platelets: 246 10*3/uL (ref 150–400)
RBC: 1.46 MIL/uL — ABNORMAL LOW (ref 3.87–5.11)
RDW: 17.9 % — ABNORMAL HIGH (ref 11.5–15.5)
WBC: 5.5 10*3/uL (ref 4.0–10.5)
nRBC: 0 % (ref 0.0–0.2)

## 2022-11-12 LAB — RETICULOCYTES
Immature Retic Fract: 31.1 % — ABNORMAL HIGH (ref 2.3–15.9)
RBC.: 1.47 MIL/uL — ABNORMAL LOW (ref 3.87–5.11)
Retic Count, Absolute: 43.5 10*3/uL (ref 19.0–186.0)
Retic Ct Pct: 3 % (ref 0.4–3.1)

## 2022-11-12 LAB — FERRITIN: Ferritin: 520 ng/mL — ABNORMAL HIGH (ref 11–307)

## 2022-11-12 LAB — BASIC METABOLIC PANEL
Anion gap: 8 (ref 5–15)
BUN: 42 mg/dL — ABNORMAL HIGH (ref 8–23)
CO2: 18 mmol/L — ABNORMAL LOW (ref 22–32)
Calcium: 8.6 mg/dL — ABNORMAL LOW (ref 8.9–10.3)
Chloride: 116 mmol/L — ABNORMAL HIGH (ref 98–111)
Creatinine, Ser: 1.02 mg/dL — ABNORMAL HIGH (ref 0.44–1.00)
GFR, Estimated: 55 mL/min — ABNORMAL LOW (ref >60)
Glucose, Bld: 108 mg/dL — ABNORMAL HIGH (ref 70–99)
Potassium: 3.5 mmol/L (ref 3.5–5.1)
Sodium: 142 mmol/L (ref 135–145)

## 2022-11-12 LAB — HEMOGLOBIN AND HEMATOCRIT, BLOOD
HCT: 18.8 % — ABNORMAL LOW (ref 36.0–46.0)
HCT: 16.5 % — ABNORMAL LOW (ref 36.0–46.0)
Hemoglobin: 5.9 g/dL — CL (ref 12.0–15.0)
Hemoglobin: 5 g/dL — CL (ref 12.0–15.0)

## 2022-11-12 LAB — VITAMIN B12: Vitamin B-12: 136 pg/mL — ABNORMAL LOW (ref 180–914)

## 2022-11-12 LAB — MRSA NEXT GEN BY PCR, NASAL: MRSA by PCR Next Gen: NOT DETECTED

## 2022-11-12 LAB — FOLATE: Folate: 13.7 ng/mL (ref >5.9)

## 2022-11-12 LAB — MAGNESIUM: Magnesium: 2.3 mg/dL (ref 1.7–2.4)

## 2022-11-12 MED ORDER — MEDIHONEY WOUND/BURN DRESSING EX PSTE
1.0000 | PASTE | Freq: Every day | CUTANEOUS | Status: DC
  Administered 2022-11-12 – 2022-11-17 (×6): 1 via TOPICAL
  Filled 2022-11-12: qty 44

## 2022-11-12 MED ORDER — CYANOCOBALAMIN 1000 MCG/ML IJ SOLN
1000.0000 ug | Freq: Every day | INTRAMUSCULAR | Status: AC
  Administered 2022-11-12 – 2022-11-13 (×2): 1000 ug via INTRAMUSCULAR
  Filled 2022-11-12 (×2): qty 1

## 2022-11-12 MED ORDER — VITAMIN B-12 1000 MCG PO TABS
1000.0000 ug | ORAL_TABLET | Freq: Every day | ORAL | Status: DC
  Administered 2022-11-14 – 2022-11-17 (×4): 1000 ug via ORAL
  Filled 2022-11-12 (×4): qty 1

## 2022-11-12 MED ORDER — SODIUM CHLORIDE 0.9 % IV SOLN
250.0000 mg | Freq: Once | INTRAVENOUS | Status: AC
  Administered 2022-11-12: 250 mg via INTRAVENOUS
  Filled 2022-11-12: qty 20

## 2022-11-12 NOTE — Consult Note (Signed)
WOC Nurse Consult Note: Reason for Consult: Unstageable Coccyx wound  Wound type: Unstageable Coccyx Pressure Injury  Pressure Injury POA: Yes Measurement: 2 cm x 1 cm 75% slough 25% pink moist   Drainage (amount, consistency, odor) minimal serosanguinous  Periwound: macerated  Dressing procedure/placement/frequency: Clean coccyx ulcer with NS, apply Medihoney to wound bed daily, cover with dry gauze and silicone foam. May lift foam daily to replace Medihoney.  Change foam dressing q3 days and prn soiling.    POC discussed with bedside nurse.  WOC team will not follow at this time.  Re-consult if further needs arise.   Thank you,    Priscella Mann MSN, RN-BC, [employer] [phone]

## 2022-11-12 NOTE — Plan of Care (Signed)

## 2022-11-12 NOTE — TOC Initial Note (Signed)
Transition of Care [facility]) - Initial/Assessment Note    Patient Details  Name: Kathleen Dorsey MRN: [id number] Date of Birth: 09-25-1940  Transition of Care [facility]) CM/SW Contact:    Larrie Kass, LCSW Phone Number: 11/12/2022, 1:06 PM  Clinical Narrative:                 Spoke to sheila with [facility] , pt is a LTC resident from [facility]. TOC to follow for D/c needs.    Expected Discharge Plan: Long Term Nursing Home Barriers to Discharge: Continued Medical Work up   Patient Goals and CMS Choice            Expected Discharge Plan and Services                                              Prior Living Arrangements/Services                       Activities of Daily Living Home Assistive Devices/Equipment: Environmental consultant (specify type) ADL Screening (condition at time of admission) Patient's cognitive ability adequate to safely complete daily activities?: No Is the patient deaf or have difficulty hearing?: No Does the patient have difficulty seeing, even when wearing glasses/contacts?: No Does the patient have difficulty concentrating, remembering, or making decisions?: No Patient able to express need for assistance with ADLs?: Yes Does the patient have difficulty dressing or bathing?: Yes Independently performs ADLs?: No Does the patient have difficulty walking or climbing stairs?: Yes Weakness of Legs: Both Weakness of Arms/Hands: Both  Permission Sought/Granted                  Emotional Assessment              Admission diagnosis:  Dehydration [E86.0] Vaginal bleeding [N93.9] Acute GI bleeding [K92.2] Iron deficiency anemia, unspecified iron deficiency anemia type [D50.9] Patient Active Problem List   Diagnosis Date Noted   Acute GI bleeding 11/11/2022   Acute blood loss anemia 11/11/2022   Dementia (HCC) 11/11/2022   Failure to thrive in adult 09/10/2022   Thrombocytopenia (HCC) 11/05/2021   Memory loss 07/12/2019    Hyperuricemia 10/05/2013   Pancytopenia (HCC) 10/05/2013   Hypertension, benign 04/23/2013   PCP:  Patient, No Pcp Per Pharmacy:   [facility] [place], [place] - [address] [place] [address] Phone: [phone] Fax: [phone]     Social Determinants of Health (SDOH) Social History: SDOH Screenings   Food Insecurity: No Food Insecurity (11/11/2022)  Housing: Low Risk  (11/11/2022)  Transportation Needs: No Transportation Needs (11/11/2022)  Utilities: Not At Risk (11/11/2022)  Tobacco Use: Medium Risk (05/11/2022)   Received from [facility]   SDOH Interventions:     Readmission Risk Interventions     No data to display

## 2022-11-12 NOTE — Consult Note (Signed)
Reason for Consult: GI Bleed Referring Physician: [facility]  Lalla C[place] HPI: This is an 82 year old female with a PMH of dementia, HTN, and gout admitted for an anemia and hypotension.  Upon admission the patient's HGB was at 5.5 g/dL.  Her BUN was noted to be at 57.  Compared to 2020 her creatinine was higher with a lower BUN.  Examination in the ER showed that she was heme positive.  With IV hydration her HGB dropped down to 4.3 g/dL.  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  There were no reports of chest pain or SOB.  Her iron saturation was noted to be at 9%, but her ferritin was at 520.  Past Medical History:  Diagnosis Date   Dementia (HCC) 11/11/2022   Gout 11/11/2022   Hypertension 11/11/2022    History reviewed. No pertinent surgical history.  History reviewed. No pertinent family history.  Social History:  has no history on file for tobacco use, alcohol use, and drug use.  Allergies:  Allergies  Allergen Reactions   Nitrous Oxide     unknown   Iodinated Contrast Media Itching and Rash   Penicillins Rash    Medications: Scheduled:  cyanocobalamin  1,000 mcg Intramuscular Daily   Followed by   Melene Muller ON 11/14/2022] vitamin B-12  1,000 mcg Oral Daily   leptospermum manuka honey  1 Application Topical Daily   [START ON 11/15/2022] pantoprazole  40 mg Intravenous Q12H   sodium chloride flush  3 mL Intravenous Q12H   Continuous:  pantoprazole 8 mg/hr (11/12/22 1632)    Results for orders placed or performed during the hospital encounter of 11/11/22 (from the past 24 hour(s))  CBC     Status: Abnormal   Collection Time: 11/11/22  4:19 PM  Result Value Ref Range   WBC 6.2 4.0 - 10.5 K/uL   RBC 1.89 (L) 3.87 - 5.11 MIL/uL   Hemoglobin 5.5 (LL) 12.0 - 15.0 g/dL   HCT 46.9 (L) 62.9 - 52.8 %   MCV 93.1 80.0 - 100.0 fL   MCH 29.1 26.0 - 34.0 pg   MCHC 31.3 30.0 - 36.0 g/dL   RDW 41.3 (H) 24.4 - 01.0 %   Platelets 275 150 - 400  K/uL   nRBC 0.0 0.0 - 0.2 %  Comprehensive metabolic panel     Status: Abnormal   Collection Time: 11/11/22  4:19 PM  Result Value Ref Range   Sodium 142 135 - 145 mmol/L   Potassium 3.3 (L) 3.5 - 5.1 mmol/L   Chloride 114 (H) 98 - 111 mmol/L   CO2 19 (L) 22 - 32 mmol/L   Glucose, Bld 117 (H) 70 - 99 mg/dL   BUN 57 (H) 8 - 23 mg/dL   Creatinine, Ser 2.72 0.44 - 1.00 mg/dL   Calcium 8.1 (L) 8.9 - 10.3 mg/dL   Total Protein 7.6 6.5 - 8.1 g/dL   Albumin 2.8 (L) 3.5 - 5.0 g/dL   AST 19 15 - 41 U/L   ALT 12 0 - 44 U/L   Alkaline Phosphatase 58 38 - 126 U/L   Total Bilirubin 0.4 0.3 - 1.2 mg/dL   GFR, Estimated >53 >66 mL/min   Anion gap 9 5 - 15  POC occult blood, ED     Status: Abnormal   Collection Time: 11/11/22  4:26 PM  Result Value Ref Range   Fecal Occult Bld POSITIVE (A) NEGATIVE  Basic metabolic panel  Status: Abnormal   Collection Time: 11/12/22  4:29 AM  Result Value Ref Range   Sodium 142 135 - 145 mmol/L   Potassium 3.5 3.5 - 5.1 mmol/L   Chloride 116 (H) 98 - 111 mmol/L   CO2 18 (L) 22 - 32 mmol/L   Glucose, Bld 108 (H) 70 - 99 mg/dL   BUN 42 (H) 8 - 23 mg/dL   Creatinine, Ser 1.61 (H) 0.44 - 1.00 mg/dL   Calcium 8.6 (L) 8.9 - 10.3 mg/dL   GFR, Estimated 55 (L) >60 mL/min   Anion gap 8 5 - 15  Magnesium     Status: None   Collection Time: 11/12/22  4:29 AM  Result Value Ref Range   Magnesium 2.3 1.7 - 2.4 mg/dL  CBC     Status: Abnormal   Collection Time: 11/12/22  4:29 AM  Result Value Ref Range   WBC 5.5 4.0 - 10.5 K/uL   RBC 1.46 (L) 3.87 - 5.11 MIL/uL   Hemoglobin 4.3 (LL) 12.0 - 15.0 g/dL   HCT 09.6 (L) 04.5 - 40.9 %   MCV 95.2 80.0 - 100.0 fL   MCH 29.5 26.0 - 34.0 pg   MCHC 30.9 30.0 - 36.0 g/dL   RDW 81.1 (H) 91.4 - 78.2 %   Platelets 246 150 - 400 K/uL   nRBC 0.0 0.0 - 0.2 %  Vitamin B12     Status: Abnormal   Collection Time: 11/12/22  4:29 AM  Result Value Ref Range   Vitamin B-12 136 (L) 180 - 914 pg/mL  Folate     Status: None    Collection Time: 11/12/22  4:29 AM  Result Value Ref Range   Folate 13.7 >5.9 ng/mL  Iron and TIBC     Status: Abnormal   Collection Time: 11/12/22  4:29 AM  Result Value Ref Range   Iron 16 (L) 28 - 170 ug/dL   TIBC 956 (L) 213 - 086 ug/dL   Saturation Ratios 9 (L) 10.4 - 31.8 %   UIBC 166 ug/dL  Ferritin     Status: Abnormal   Collection Time: 11/12/22  4:29 AM  Result Value Ref Range   Ferritin 520 (H) 11 - 307 ng/mL  Reticulocytes     Status: Abnormal   Collection Time: 11/12/22  4:29 AM  Result Value Ref Range   Retic Ct Pct 3.0 0.4 - 3.1 %   RBC. 1.47 (L) 3.87 - 5.11 MIL/uL   Retic Count, Absolute 43.5 19.0 - 186.0 K/uL   Immature Retic Fract 31.1 (H) 2.3 - 15.9 %  MRSA Next Gen by PCR, Nasal     Status: None   Collection Time: 11/12/22  7:03 AM   Specimen: Nasal Mucosa; Nasal Swab  Result Value Ref Range   MRSA by PCR Next Gen NOT DETECTED NOT DETECTED  Hemoglobin and hematocrit, blood     Status: Abnormal   Collection Time: 11/12/22 12:11 PM  Result Value Ref Range   Hemoglobin 5.9 (LL) 12.0 - 15.0 g/dL   HCT 57.8 (L) 46.9 - 62.9 %     US PELVIS (TRANSABDOMINAL ONLY)  Result Date: 11/11/2022 CLINICAL DATA:  Postmenopausal bleeding EXAM: TRANSABDOMINAL ULTRASOUND OF PELVIS TECHNIQUE: Transabdominal ultrasound examination of the pelvis was performed including evaluation of the uterus, ovaries, adnexal regions, and pelvic cul-de-sac. COMPARISON:  CT abdomen and pelvis 11/02/2021 FINDINGS: Uterus Hysterectomy. Right ovary Not visualized. Left ovary Not visualized. Other findings: No abnormal free fluid. Unremarkable appearance of the bladder.  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  The ovaries were not visualized. Electronically Signed   By: Minerva Fester M.D.   On: 11/11/2022 18:05    ROS:  As stated above in the HPI otherwise negative.  Blood pressure (!) 108/57, pulse 75, temperature 98.2 F (36.8 C), temperature source Oral, resp. rate 16, height 5\' 3"  (1.6 m),  weight 53 kg, SpO2 100%.    PE: Gen: NAD, Alert and Oriented HEENT:  [place]/AT, EOMI Neck: Supple, no LAD Lungs: CTA Bilaterally CV: RRR without M/G/R ABD: Soft, NTND, +BS Ext: No C/C/E  Assessment/Plan: 1) Anemia. 2) Melena. 3) Heme positive stool.   The patient is clinically stable.  The rectal examination confirmed that it was melenic stool as there were reports of possible vaginal bleeding.  Her daughter Cardell Peach was called and updated about her mother's situation.  An EGD was recommended, but it cannot be performed unless the HGB is at least 7 g/dL.  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  In light of this situation, GI will sign off for now.  Plan: 1) Maintain PPI. 2) Iron infusions. 3) Signing off.  Call with questions.  Jahnia Hewes D 11/12/2022, 3:56 PM

## 2022-11-12 NOTE — Progress Notes (Signed)
PROGRESS NOTE  Kathleen Dorsey ZOX:[id number] DOB: Apr 04, 1940   PCP: Patient, No Pcp Per  Patient is from: Nursing home  DOA: 11/11/2022 LOS: 1  Chief complaints Chief Complaint  Patient presents with   Vaginal Bleeding   Hypotension     Brief Narrative / Interim history: 82 year old F with PMH of dementia, anemia, HTN, gout and chronic anticoagulation (unknown indication) brought to ED due to concern for bleeding and low blood pressure, and admitted for acute on chronic blood loss anemia.  Hgb 5.5 (from 9.7 in 08/2022).  BUN elevated to 57 with normal creatinine.  Hemoccult positive.  Patient is Jehovah witness and does not want blood products.  Reportedly on Eliquis until 8/30.  Unclear indication.  Patient and family not sure why she is on Eliquis either.  GI consulted.  Hgb dropped further to 4.3.  GI evaluation pending.   Subjective: Seen and examined earlier this morning.  No major events overnight of this morning.  She has no complaints.  Denies dizziness, shortness of breath or chest pain.  She is oriented x 4 but not a great historian.  Objective: Vitals:   11/11/22 2107 11/12/22 0127 11/12/22 0500 11/12/22 0619  BP: 133/61 (!) 105/52  (!) 100/52  Pulse: 83 88  73  Resp: 18 18  16   Temp: 98.3 F (36.8 C) 98.7 F (37.1 C)  98.2 F (36.8 C)  TempSrc:  Oral    SpO2: 100% 100%  100%  Weight:   53 kg   Height:        Examination:  GENERAL: No apparent distress.  Appears pale.  Nontoxic. HEENT: Appears pale.  Vision and hearing grossly intact.  NECK: Supple.  No apparent JVD.  RESP:  No IWOB.  Fair aeration bilaterally. CVS:  RRR. Heart sounds normal.  ABD/GI/GU: BS+. Abd soft, NTND.  MSK/EXT:  Moves extremities. No apparent deformity. No edema.  SKIN: Sacral decubitus NEURO: Awake, alert and oriented appropriately.  No apparent focal neuro deficit. PSYCH: Calm. Normal affect.   Procedures:  None  Microbiology summarized: None  Assessment and plan: Principal  Problem:   Acute GI bleeding Active Problems:   Acute blood loss anemia   Dementia (HCC)   Hypertension  Acute on chronic blood loss anemia likely due to upper GI bleed: Hgb 9.7 on 09/07/2022.  Hemoccult positive.  Patient is Jehovah witness and does not want blood products.  Patient is still full code.  Reportedly on Eliquis until 8/30.  No clear indication.  Anemia panel with some iron deficiency and B12 deficiency. Recent Labs    11/11/22 1619 11/12/22 0429  HGB 5.5* 4.3*  -Monitor H&H -Continue PPI drip -Continue clear liquid diet -IV iron and B12 injection followed by p.o. -Palliative consulted for goal of care.  Dementia without behavioral disturbance: Oriented x 4 but very limited insight. -Reorientation and delirium precaution  History of gout: Stable  Hypertension?  Not on antihypertensive meds.  Goal of care counseling: Patient with advanced dementia now with severe anemia in the setting of blood loss.  Hemoglobin 4.3.  She is very pale.  She is Jehovah witness, and does not want blood transfusion.  Still full code.  Discussed my concern with patient's daughter, Kathleen Dorsey over the phone.  I was not able to reach her other daughter, Kathleen Dorsey.  Discussed my concern about her CODE STATUS in the setting of his severe anemia, frailty and dementia.  I recommended DNR/DNI.  Ms. Kathleen Dorsey will discuss with the other daughter  and call us back.  Meanwhile, I consulted palliative care   Body mass index is 20.7 kg/m.  Pressure skin injury: POA Pressure Injury 11/11/22 Coccyx Medial Unstageable - Full thickness tissue loss in which the base of the injury is covered by slough (yellow, tan, gray, green or brown) and/or eschar (tan, brown or black) in the wound bed. (Active)  11/11/22 2102  Location: Coccyx  Location Orientation: Medial  Staging: Unstageable - Full thickness tissue loss in which the base of the injury is covered by slough (yellow, tan, gray, green or brown) and/or  eschar (tan, brown or black) in the wound bed.  Wound Description (Comments):   Present on Admission: Yes   DVT prophylaxis:  SCDs Start: 11/11/22 1901  Code Status: Full code.  Confirmed with patient's daughter Family Communication: None at bedside.  Discussed with patient's daughter, Kathleen Dorsey over the phone Level of care: Progressive Status is: Inpatient Remains inpatient appropriate because:    Final disposition: Back to LTC Consultants:  Gastroenterology Palliative medicine  55 minutes with more than 50% spent in reviewing records, counseling patient/family and coordinating care.   Sch Meds:  Scheduled Meds:  cyanocobalamin  1,000 mcg Intramuscular Daily   Followed by   Melene Muller ON 11/14/2022] vitamin B-12  1,000 mcg Oral Daily   leptospermum manuka honey  1 Application Topical Daily   [START ON 11/15/2022] pantoprazole  40 mg Intravenous Q12H   sodium chloride flush  3 mL Intravenous Q12H   Continuous Infusions:  sodium chloride 70 mL/hr at 11/12/22 1030   ferric gluconate (FERRLECIT) IVPB 250 mg (11/12/22 0952)   pantoprazole 8 mg/hr (11/12/22 0523)   PRN Meds:.acetaminophen **OR** acetaminophen, ondansetron **OR** ondansetron (ZOFRAN) IV  Antimicrobials: Anti-infectives (From admission, onward)    None        I have personally reviewed the following labs and images: CBC: Recent Labs  Lab 11/11/22 1619 11/12/22 0429  WBC 6.2 5.5  HGB 5.5* 4.3*  HCT 17.6* 13.9*  MCV 93.1 95.2  PLT 275 246   BMP &GFR Recent Labs  Lab 11/11/22 1619 11/12/22 0429  NA 142 142  K 3.3* 3.5  CL 114* 116*  CO2 19* 18*  GLUCOSE 117* 108*  BUN 57* 42*  CREATININE 0.87 1.02*  CALCIUM 8.1* 8.6*  MG  --  2.3   Estimated Creatinine Clearance: 35.2 mL/min (A) (by C-G formula based on SCr of 1.02 mg/dL (H)). Liver & Pancreas: Recent Labs  Lab 11/11/22 1619  AST 19  ALT 12  ALKPHOS 58  BILITOT 0.4  PROT 7.6  ALBUMIN 2.8*   No results for input(s): "LIPASE",  "AMYLASE" in the last 168 hours. No results for input(s): "AMMONIA" in the last 168 hours. Diabetic: No results for input(s): "HGBA1C" in the last 72 hours. No results for input(s): "GLUCAP" in the last 168 hours. Cardiac Enzymes: No results for input(s): "CKTOTAL", "CKMB", "CKMBINDEX", "TROPONINI" in the last 168 hours. No results for input(s): "PROBNP" in the last 8760 hours. Coagulation Profile: No results for input(s): "INR", "PROTIME" in the last 168 hours. Thyroid Function Tests: No results for input(s): "TSH", "T4TOTAL", "FREET4", "T3FREE", "THYROIDAB" in the last 72 hours. Lipid Profile: No results for input(s): "CHOL", "HDL", "LDLCALC", "TRIG", "CHOLHDL", "LDLDIRECT" in the last 72 hours. Anemia Panel: Recent Labs    11/12/22 0429  VITAMINB12 136*  FOLATE 13.7  FERRITIN 520*  TIBC 182*  IRON 16*  RETICCTPCT 3.0   Urine analysis:    Component Value Date/Time   COLORURINE  YELLOW 12/04/2018 2010   APPEARANCEUR CLEAR 12/04/2018 2010   LABSPEC 1.025 12/04/2018 2010   PHURINE 5.5 12/04/2018 2010   GLUCOSEU NEGATIVE 12/04/2018 2010   HGBUR NEGATIVE 12/04/2018 2010   BILIRUBINUR MODERATE (A) 12/04/2018 2010   KETONESUR 15 (A) 12/04/2018 2010   PROTEINUR NEGATIVE 12/04/2018 2010   NITRITE NEGATIVE 12/04/2018 2010   LEUKOCYTESUR NEGATIVE 12/04/2018 2010   Sepsis Labs: Invalid input(s): "PROCALCITONIN", "LACTICIDVEN"  Microbiology: Recent Results (from the past 240 hour(s))  MRSA Next Gen by PCR, Nasal     Status: None   Collection Time: 11/12/22  7:03 AM   Specimen: Nasal Mucosa; Nasal Swab  Result Value Ref Range Status   MRSA by PCR Next Gen NOT DETECTED NOT DETECTED Final    Comment: (NOTE) The GeneXpert MRSA Assay (FDA approved for NASAL specimens only), is one component of a comprehensive MRSA colonization surveillance program. It is not intended to diagnose MRSA infection nor to guide or monitor treatment for MRSA infections. Test performance is not FDA  approved in patients less than 85 years old. Performed at [facility], [address], [place], [place] [address]     Radiology Studies: US PELVIS (TRANSABDOMINAL ONLY)  Result Date: 11/11/2022 CLINICAL DATA:  Postmenopausal bleeding EXAM: TRANSABDOMINAL ULTRASOUND OF PELVIS TECHNIQUE: Transabdominal ultrasound examination of the pelvis was performed including evaluation of the uterus, ovaries, adnexal regions, and pelvic cul-de-sac. COMPARISON:  CT abdomen and pelvis 11/02/2021 FINDINGS: Uterus Hysterectomy. Right ovary Not visualized. Left ovary Not visualized. Other findings: No abnormal free fluid. Unremarkable appearance of the bladder. 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  The ovaries were not visualized. Electronically Signed   By: Minerva Fester M.D.   On: 11/11/2022 18:05      Carlon Davidson T. Samnang Shugars [facility]  If 7PM-7AM, please contact night-coverage [email or web address] 11/12/2022, 11:20 AM

## 2022-11-12 NOTE — IPAL (Signed)
  Interdisciplinary Goals of Care Family Meeting   Date carried out: 11/12/2022  Location of the meeting: Bedside  Member's involved: Physician and Family Member or next of kin (Daughter, Kathleen Dorsey)  Durable Power of Pensions consultant or acting medical decision maker: Kathleen Dorsey Extensive goals of care discussion with focus on CODE STATUS with patient's daughter, Kathleen Dorsey at bedside.  Patient with severe anemia in the setting of acute on chronic GI bleed.  Patient and family against blood transfusion due to religious mother.  Patient is over weight loss.  She also has dementia and ambulatory dysfunction.  She was able to use wheelchair previously.  Now bedbound for most part.  We discussed pros and cons of CPR and intubation in light of her comorbidity.  After our discussion, Kathleen Dorsey believes DNR/DNI is to the patient's best interest.  I am in agreement and supportive of her decision.  Changed CODE STATUS to DNR/DNI.  Notified patient's RN.  Discussion: We discussed goals of care for Kathleen Dorsey .    Code status:   Code Status: Limited: Do not attempt resuscitation (DNR) -DNR-LIMITED -Do Not Intubate/DNI    Disposition: Continue current acute care  Time spent for the meeting: 20 minutes    Almon Hercules, MD  11/12/2022, 7:51 PM

## 2022-11-13 ENCOUNTER — Encounter (HOSPITAL_COMMUNITY): Payer: Self-pay | Admitting: Family Medicine

## 2022-11-13 DIAGNOSIS — N939 Abnormal uterine and vaginal bleeding, unspecified: Secondary | ICD-10-CM | POA: Diagnosis not present

## 2022-11-13 DIAGNOSIS — K922 Gastrointestinal hemorrhage, unspecified: Secondary | ICD-10-CM | POA: Diagnosis not present

## 2022-11-13 DIAGNOSIS — D509 Iron deficiency anemia, unspecified: Secondary | ICD-10-CM

## 2022-11-13 DIAGNOSIS — E860 Dehydration: Secondary | ICD-10-CM

## 2022-11-13 LAB — RENAL FUNCTION PANEL
Albumin: 2.4 g/dL — ABNORMAL LOW (ref 3.5–5.0)
Anion gap: 8 (ref 5–15)
BUN: 25 mg/dL — ABNORMAL HIGH (ref 8–23)
CO2: 17 mmol/L — ABNORMAL LOW (ref 22–32)
Calcium: 8.2 mg/dL — ABNORMAL LOW (ref 8.9–10.3)
Chloride: 112 mmol/L — ABNORMAL HIGH (ref 98–111)
Creatinine, Ser: 0.84 mg/dL (ref 0.44–1.00)
GFR, Estimated: >60 mL/min (ref >60)
Glucose, Bld: 95 mg/dL (ref 70–99)
Phosphorus: 3.4 mg/dL (ref 2.5–4.6)
Potassium: 4.2 mmol/L (ref 3.5–5.1)
Sodium: 137 mmol/L (ref 135–145)

## 2022-11-13 LAB — CBC
HCT: 16.7 % — ABNORMAL LOW (ref 36.0–46.0)
Hemoglobin: 5 g/dL — CL (ref 12.0–15.0)
MCH: 28.6 pg (ref 26.0–34.0)
MCHC: 29.9 g/dL — ABNORMAL LOW (ref 30.0–36.0)
MCV: 95.4 fL (ref 80.0–100.0)
Platelets: 239 10*3/uL (ref 150–400)
RBC: 1.75 MIL/uL — ABNORMAL LOW (ref 3.87–5.11)
RDW: 17.4 % — ABNORMAL HIGH (ref 11.5–15.5)
WBC: 5.4 10*3/uL (ref 4.0–10.5)
nRBC: 0 % (ref 0.0–0.2)

## 2022-11-13 LAB — HEMOGLOBIN AND HEMATOCRIT, BLOOD
HCT: 19 % — ABNORMAL LOW (ref 36.0–46.0)
HCT: 16.8 % — ABNORMAL LOW (ref 36.0–46.0)
Hemoglobin: 5.7 g/dL — CL (ref 12.0–15.0)
Hemoglobin: 5.2 g/dL — CL (ref 12.0–15.0)

## 2022-11-13 LAB — MAGNESIUM: Magnesium: 2.1 mg/dL (ref 1.7–2.4)

## 2022-11-13 MED ORDER — DARBEPOETIN ALFA 150 MCG/0.3ML IJ SOSY
150.0000 ug | PREFILLED_SYRINGE | Freq: Once | INTRAMUSCULAR | Status: AC
  Administered 2022-11-13: 150 ug via SUBCUTANEOUS
  Filled 2022-11-13: qty 0.3

## 2022-11-13 MED ORDER — MELATONIN 3 MG PO TABS
3.0000 mg | ORAL_TABLET | Freq: Every day | ORAL | Status: DC
  Administered 2022-11-13 – 2022-11-16 (×4): 3 mg via ORAL
  Filled 2022-11-13 (×4): qty 1

## 2022-11-13 MED ORDER — HALOPERIDOL LACTATE 5 MG/ML IJ SOLN: 1.0000 mg | Freq: Four times a day (QID) | INTRAMUSCULAR | Status: DC | PRN

## 2022-11-13 MED ORDER — DARBEPOETIN ALFA 40 MCG/0.4ML IJ SOSY: 40.0000 ug | PREFILLED_SYRINGE | Freq: Once | INTRAMUSCULAR | Status: DC

## 2022-11-13 NOTE — Plan of Care (Signed)

## 2022-11-13 NOTE — Consult Note (Signed)
Consultation Note Date: 11/13/2022   Patient Name: Kathleen Dorsey  DOB: 11/05/1940  MRN: [id number]  Age / Sex: 82 y.o., female  PCP: Patient, No Pcp Per Referring Physician: Marinda Elk, MD  Reason for Consultation: Establishing goals of care  HPI/Patient Profile: 82 y.o. female    admitted on 11/11/2022   Clinical Assessment and Goals of Care: 82 year old lady with dementia normocytic anemia essential hypertension anticoagulation, refusal of blood products due to religious beliefs, brought into the emergency department because of bleeding and low blood pressure.  Hemoglobin on admission 5.5 g/dL.  Hemoccult positive.  GI consulted.  Eliquis was discontinued.  Patient is a Scientist, product/process development.  GI performed rectal exam that showed melenic stool.  Patient started on IV Protonix drip, given IV iron.  Started on clear liquids.  She continues on IV PPI.  Remains admitted to hospital medicine service.  Palliative consult for ongoing goals of care discussions. Palliative consult request received.  Patient seen and examined.  Family member present at bedside.  Call placed but unable to reach patient's daughter eke a Tunks at [phone].  Patient under consideration for erythropoietin stimulating agents awaiting patient's family's consent for the same.   NEXT OF KIN  Daughters.   SUMMARY OF RECOMMENDATIONS   Agree with DNR Continue current mode of care Monitor hospital course, patient is a long-term resident at [facility]-recommend addition of palliative services at her long-term care facility. Thank you for the consult.  Code Status/Advance Care Planning: DNR   Symptom Management:     Palliative Prophylaxis:  Delirium Protocol  Additional Recommendations (Limitations, Scope, Preferences): No PRBC  Psycho-social/Spiritual:  Desire for further Chaplaincy support:yes Additional Recommendations:  Caregiving  Support/Resources  Prognosis:  Unable to determine  Discharge Planning:   Recommend palliative care at long-term facility [facility]    Primary Diagnoses: Present on Admission:  Acute GI bleeding  Acute blood loss anemia  Dementia (HCC)  Hypertension, benign   I have reviewed the medical record, interviewed the patient and family, and examined the patient. The following aspects are pertinent.  Past Medical History:  Diagnosis Date   Dementia (HCC) 11/11/2022   Gout 11/11/2022   Hypertension 11/11/2022   Social History   Socioeconomic History   Marital status: Married    Spouse name: Not on file   Number of children: Not on file   Years of education: Not on file   Highest education level: Not on file  Occupational History   Not on file  Tobacco Use   Smoking status: Every Day    Types: Cigarettes   Smokeless tobacco: Not on file  Vaping Use   Vaping status: Unknown  Substance and Sexual Activity   Alcohol use: Not on file   Drug use: Not on file   Sexual activity: Not on file  Other Topics Concern   Not on file  Social History Narrative   Not on file   Social Determinants of Health   Financial Resource Strain: Not on file  Food Insecurity:  No Food Insecurity (11/11/2022)   Hunger Vital Sign    Worried About Running Out of Food in the Last Year: Never true    Ran Out of Food in the Last Year: Never true  Transportation Needs: No Transportation Needs (11/11/2022)   PRAPARE - Administrator, Civil Service (Medical): No    Lack of Transportation (Non-Medical): No  Physical Activity: Not on file  Stress: Not on file  Social Connections: Not on file   History reviewed. No pertinent family history. Scheduled Meds:  [START ON 11/14/2022] vitamin B-12  1,000 mcg Oral Daily   leptospermum manuka honey  1 Application Topical Daily   melatonin  3 mg Oral QHS   [START ON 11/15/2022] pantoprazole  40 mg Intravenous Q12H   sodium chloride flush  3  mL Intravenous Q12H   Continuous Infusions:  pantoprazole 8 mg/hr (11/13/22 0903)   PRN Meds:.acetaminophen **OR** acetaminophen, haloperidol lactate, ondansetron **OR** ondansetron (ZOFRAN) IV Medications Prior to Admission:  Prior to Admission medications   Medication Sig Start Date End Date Taking? Authorizing Provider  allopurinol (ZYLOPRIM) 100 MG tablet Take 100 mg by mouth daily.   Yes [provider]  diclofenac Sodium (VOLTAREN) 1 % GEL Apply 2 g topically 2 (two) times daily.   Yes [provider]  mirtazapine (REMERON SOL-TAB) 15 MG disintegrating tablet Take 15 mg by mouth at bedtime.   Yes [provider]  rosuvastatin (CRESTOR) 5 MG tablet Take 5 mg by mouth daily.   Yes [provider]  senna-docusate (SENOKOT-S) 8.6-50 MG tablet Take 2 tablets by mouth at bedtime.   Yes [provider]  traMADol (ULTRAM) 50 MG tablet Take 50 mg by mouth every 6 (six) hours as needed for moderate pain.   Yes [provider]  Vitamin D, Ergocalciferol, (DRISDOL) 1.25 MG (50000 UNIT) CAPS capsule Take 50,000 Units by mouth every 7 (seven) days.   Yes [provider]  folic acid (FOLVITE) 1 MG tablet Take 1 tablet (1 mg total) by mouth daily. Patient not taking: Reported on 11/11/2022 12/04/18   Gwyneth Sprout, MD  thiamine (VITAMIN B-1) 100 MG tablet Take 1 tablet (100 mg total) by mouth daily. Patient not taking: Reported on 11/11/2022 12/04/18   Gwyneth Sprout, MD   Allergies  Allergen Reactions   Nitrous Oxide     unknown   Iodinated Contrast Media Itching and Rash   Penicillins Rash   Review of Systems Denies any chest pain denies any shortness of breath  Physical Exam Elderly lady sitting up in bed No acute distress Regular work of breathing Abdomen not distended No peripheral edema Awake alert Vital Signs: BP 129/65 (BP Location: Left Arm)   Pulse 90   Temp 98.4 F (36.9 C) (Oral)   Resp 18   Ht 5\' 3"  (1.6 m)    Wt 51.8 kg   SpO2 100%   BMI 20.23 kg/m  Pain Scale: 0-10   Pain Score: 0-No pain   SpO2: SpO2: 100 % O2 Device:SpO2: 100 % O2 Flow Rate: .   IO: Intake/output summary:  Intake/Output Summary (Last 24 hours) at 11/13/2022 1450 Last data filed at 11/13/2022 0132 Gross per 24 hour  Intake 1632.62 ml  Output 1350 ml  Net 282.62 ml    LBM: Last BM Date : 11/12/22 Baseline Weight: Weight: 59 kg Most recent weight: Weight: 51.8 kg     Palliative Assessment/Data:   Palliative performance scale 60%  Time In: 1400 Time Out:  1500 Time Total: 60 Greater than 50%  of this time was spent counseling and coordinating care related to the above assessment and plan.  Signed by: Rosalin Hawking, MD   Please contact Palliative Medicine Team phone at [phone] for questions and concerns.  For individual provider: See Loretha Stapler

## 2022-11-13 NOTE — Progress Notes (Signed)
[facility] PROGRESS NOTE    Progress Note  Kathleen Dorsey  RUE:[id number] DOB: 06-18-1940 DOA: 11/11/2022 PCP: Patient, No Pcp Per     Brief Narrative:   Kathleen Dorsey is an 82 y.o. female past medical history of dementia, normocytic anemia essential hypertension, anticoagulation with an unknown indication refusal of blood products due to religious believes brought into the ED for bleeding and low blood pressure hemoglobin admission was 5.5 (on 6/24 was 9.7).  Hemoccult was positive   Assessment/Plan:   Acute GI bleeding/acute blood loss anemia likely due to GI loss: Hemoccult positive, patient is a Jehovah's Witness refuses blood products. She is currently DNR/DNI. Eliquis was discontinued.  She is no longer a candidate for anticoagulation. Started on IV Protonix drip, status post IV iron. Currently on clear liquid diet.   GI was consulted and due to her hemoglobin being 5 we cannot perform EGD.  GI performed a rectal exam that showed melanotic stools.  In light of the situation GI has signed off. Continue IV PPI. I have been unsuccessful in trying to reach the daughter, will discuss with them to see if erythropoietin can be given.  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 Family to be at bedside and reorient.  History of gout: Stable.  Elevated blood pressure: Without a diagnosis of hypertension.  Goals of care: Patient has advanced dementia and now with severe anemia due to blood loss patient appears pale she is a Jehovah's Witness and does not want blood transfusions. Previous physician had extensive conversation with daughter and due to her severe anemia which seems to be acute on chronic she needs an EGD, but EGD cannot be performed in light of hemoglobin less than 7.  We respect the family's wishes. GI was consulted who recommended an EGD but cannot be performed with a hemoglobin less than 7.  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 (Active)  11/11/22 2102  Location: Coccyx  Location Orientation: Medial  Staging: Unstageable - Full thickness tissue loss in which the base of the injury is covered by slough (yellow, tan, gray, green or brown) and/or eschar (tan, brown or black) in the wound bed.  Wound Description (Comments):   Present on Admission: Yes  Dressing Type Foam - Lift dressing to assess site every shift 11/13/22 0904     DVT prophylaxis: scd Family Communication:daughter Status is: Inpatient due to acute GI bleed     Code Status:     Code Status Orders  (From admission, onward)           Start     Ordered   11/12/22 1324  Do not attempt resuscitation (DNR)- Limited -Do Not Intubate (DNI)  (Code Status)  Continuous       Question Answer Comment  If pulseless and not breathing No CPR or chest compressions.   In Pre-Arrest Conditions (Patient Is Breathing and Has A Pulse) Do not intubate. Provide all appropriate non-invasive medical interventions. Avoid ICU transfer unless indicated or required.   Consent: Discussion documented in EHR or advanced directives reviewed      11/12/22 1323           Code Status History     Date Active Date Inactive Code  Status Order ID Comments User Context   11/11/2022 1901 11/12/2022 1323 Full Code [id number]  Briscoe Deutscher, MD ED         IV Access:   Peripheral IV   Procedures and diagnostic studies:   US PELVIS (TRANSABDOMINAL ONLY)  Result Date: 11/11/2022 CLINICAL DATA:  Postmenopausal bleeding EXAM: TRANSABDOMINAL ULTRASOUND OF PELVIS TECHNIQUE: Transabdominal ultrasound examination of the pelvis was performed including evaluation of the uterus, ovaries, adnexal  regions, and pelvic cul-de-sac. COMPARISON:  CT abdomen and pelvis 11/02/2021 FINDINGS: Uterus Hysterectomy. Right ovary Not visualized. Left ovary Not visualized. Other findings: No abnormal free fluid. Unremarkable appearance of the bladder. 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  The ovaries were not visualized. Electronically Signed   By: Minerva Fester M.D.   On: 11/11/2022 18:05     Medical Consultants:   None.   Subjective:    Kathleen Dorsey no complaints this morning no further melanotic stools.  Objective:    Vitals:   11/12/22 1253 11/12/22 2056 11/13/22 0500 11/13/22 0601  BP: (!) 108/57 124/73  109/66  Pulse: 75 92  90  Resp:  17  20  Temp: 98.2 F (36.8 C) 98.5 F (36.9 C)  98.2 F (36.8 C)  TempSrc: Oral   Oral  SpO2: 100% 100%  100%  Weight:   51.8 kg   Height:       SpO2: 100 %   Intake/Output Summary (Last 24 hours) at 11/13/2022 1138 Last data filed at 11/13/2022 0132 Gross per 24 hour  Intake 1992.62 ml  Output 1350 ml  Net 642.62 ml   Filed Weights   11/11/22 1525 11/12/22 0500 11/13/22 0500  Weight: 59 kg 53 kg 51.8 kg    Exam: General exam: In no acute distress. Respiratory system: Good air movement and clear to auscultation. Cardiovascular system: S1 & S2 heard, RRR. No JVD. Gastrointestinal system: Abdomen is nondistended, soft and nontender.  Extremities: No pedal edema. Skin: No rashes, lesions or ulcers Psychiatry: No judgment or insight of medical condition.   Data Reviewed:    Labs: Basic Metabolic Panel: Recent Labs  Lab 11/11/22 1619 11/12/22 0429 11/13/22 0414  NA 142 142 137  K 3.3* 3.5 4.2  CL 114* 116* 112*  CO2 19* 18* 17*  GLUCOSE 117* 108* 95  BUN 57* 42* 25*  CREATININE 0.87 1.02* 0.84  CALCIUM 8.1* 8.6* 8.2*  MG  --  2.3 2.1  PHOS  --   --  3.4   GFR Estimated Creatinine Clearance: 42.2 mL/min (by C-G formula based on SCr of 0.84 mg/dL). Liver Function Tests: Recent Labs  Lab 11/11/22 1619  11/13/22 0414  AST 19  --   ALT 12  --   ALKPHOS 58  --   BILITOT 0.4  --   PROT 7.6  --   ALBUMIN 2.8* 2.4*   No results for input(s): "LIPASE", "AMYLASE" in the last 168 hours. No results for input(s): "AMMONIA" in the last 168 hours. Coagulation profile No results for input(s): "INR", "PROTIME" in the last 168 hours. COVID-19 Labs  Recent Labs    11/12/22 0429  FERRITIN 520*    No results found for: "SARSCOV2NAA"  CBC: Recent Labs  Lab 11/11/22 1619 11/12/22 0429 11/12/22 1211 11/12/22 1927 11/13/22 0414  WBC 6.2 5.5  --   --  5.4  HGB 5.5* 4.3* 5.9* 5.0* 5.0*  HCT 17.6* 13.9* 18.8* 16.5* 16.7*  MCV 93.1 95.2  --   --  95.4  PLT  275 246  --   --  239   Cardiac Enzymes: No results for input(s): "CKTOTAL", "CKMB", "CKMBINDEX", "TROPONINI" in the last 168 hours. BNP (last 3 results) No results for input(s): "PROBNP" in the last 8760 hours. CBG: No results for input(s): "GLUCAP" in the last 168 hours. D-Dimer: No results for input(s): "DDIMER" in the last 72 hours. Hgb A1c: No results for input(s): "HGBA1C" in the last 72 hours. Lipid Profile: No results for input(s): "CHOL", "HDL", "LDLCALC", "TRIG", "CHOLHDL", "LDLDIRECT" in the last 72 hours. Thyroid function studies: No results for input(s): "TSH", "T4TOTAL", "T3FREE", "THYROIDAB" in the last 72 hours.  Invalid input(s): "FREET3" Anemia work up: Recent Labs    11/12/22 0429  VITAMINB12 136*  FOLATE 13.7  FERRITIN 520*  TIBC 182*  IRON 16*  RETICCTPCT 3.0   Sepsis Labs: Recent Labs  Lab 11/11/22 1619 11/12/22 0429 11/13/22 0414  WBC 6.2 5.5 5.4   Microbiology Recent Results (from the past 240 hour(s))  MRSA Next Gen by PCR, Nasal     Status: None   Collection Time: 11/12/22  7:03 AM   Specimen: Nasal Mucosa; Nasal Swab  Result Value Ref Range Status   MRSA by PCR Next Gen NOT DETECTED NOT DETECTED Final    Comment: (NOTE) The GeneXpert MRSA Assay (FDA approved for NASAL specimens  only), is one component of a comprehensive MRSA colonization surveillance program. It is not intended to diagnose MRSA infection nor to guide or monitor treatment for MRSA infections. Test performance is not FDA approved in patients less than 69 years old. Performed at [facility], [address], [place], [place] [address]      Medications:    [START ON 11/14/2022] vitamin B-12  1,000 mcg Oral Daily   leptospermum manuka honey  1 Application Topical Daily   [START ON 11/15/2022] pantoprazole  40 mg Intravenous Q12H   sodium chloride flush  3 mL Intravenous Q12H   Continuous Infusions:  pantoprazole 8 mg/hr (11/13/22 0903)      LOS: 2 days   Marinda Elk  [facility]  11/13/2022, 11:38 AM

## 2022-11-13 NOTE — Plan of Care (Signed)
  Problem: Education: Goal: Knowledge of General Education information will improve Description Including pain rating scale, medication(s)/side effects and non-pharmacologic comfort measures Outcome: Progressing   Problem: Clinical Measurements: Goal: Will remain free from infection Outcome: Progressing Goal: Cardiovascular complication will be avoided Outcome: Progressing   Problem: Coping: Goal: Level of anxiety will decrease Outcome: Progressing   Problem: Pain Managment: Goal: General experience of comfort will improve Outcome: Progressing   

## 2022-11-14 DIAGNOSIS — K922 Gastrointestinal hemorrhage, unspecified: Secondary | ICD-10-CM | POA: Diagnosis not present

## 2022-11-14 DIAGNOSIS — E860 Dehydration: Secondary | ICD-10-CM | POA: Diagnosis not present

## 2022-11-14 DIAGNOSIS — N939 Abnormal uterine and vaginal bleeding, unspecified: Secondary | ICD-10-CM | POA: Diagnosis not present

## 2022-11-14 DIAGNOSIS — D509 Iron deficiency anemia, unspecified: Secondary | ICD-10-CM | POA: Diagnosis not present

## 2022-11-14 LAB — HEMOGLOBIN AND HEMATOCRIT, BLOOD
HCT: 17 % — ABNORMAL LOW (ref 36.0–46.0)
HCT: 18.6 % — ABNORMAL LOW (ref 36.0–46.0)
HCT: 17.7 % — ABNORMAL LOW (ref 36.0–46.0)
Hemoglobin: 5.2 g/dL — CL (ref 12.0–15.0)
Hemoglobin: 5.6 g/dL — CL (ref 12.0–15.0)
Hemoglobin: 5.4 g/dL — CL (ref 12.0–15.0)

## 2022-11-14 NOTE — Plan of Care (Signed)

## 2022-11-14 NOTE — Progress Notes (Addendum)
[facility] PROGRESS NOTE    Progress Note  Julya Covey  JWJ:[id number] DOB: 27-Mar-1940 DOA: 11/11/2022 PCP: Patient, No Pcp Per     Brief Narrative:   Louida Coch is an 82 y.o. female past medical history of dementia, normocytic anemia essential hypertension, anticoagulation with an unknown indication refusal of blood products due to religious believes brought into the ED for bleeding and low blood pressure hemoglobin admission was 5.5 (on 6/24 was 9.7).  Hemoccult was positive   Assessment/Plan:   Acute GI bleeding/acute blood loss anemia likely due to GI loss: Hemoccult positive, patient is a Jehovah's Witness refuses blood products. She is currently DNR/DNI. Eliquis was discontinued.  She is no longer a candidate for anticoagulation. Continue IV Protonix  to twice a day, she status post IV iron. Daughter agreed to IV Aranesp.  She has had no further bleeding. Hemoglobin continues to be low,, is around 5.2, continue to check H&H 12 hours, she has had no further melanotic stools. Advance to full liquid diet. In light of the situation GI has signed off. We have consulted palliative care to address goals of care as she will rebleed at some point in the future.  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 Family to be at bedside and reorient.  History of gout: Stable.  Elevated blood pressure: Without a diagnosis of hypertension.  Goals of care: Patient has advanced dementia and now with severe anemia due to GI bleed, patient is a TEFL teacher Witness and patient and daughter have refused blood transfusions. Previous physician had extensive conversation with daughter and due to her severe anemia which seems to be acute on chronic she needs an EGD, but EGD cannot be performed in light of hemoglobin less than 7.  We respect the family's wishes. GI was consulted who recommended an EGD but cannot be performed with a hemoglobin  less than 7.  Unstageable coccygeal ulcer present on admission: RN Pressure Injury Documentation: Pressure Injury 11/11/22 Coccyx Medial Unstageable - Full thickness tissue loss in which the base of the injury is covered by slough (yellow, tan, gray, green or brown) and/or eschar (tan, brown or black) in the wound bed. (Active)  11/11/22 2102  Location: Coccyx  Location Orientation: Medial  Staging: Unstageable - Full thickness tissue loss in which the base of the injury is covered by slough (yellow, tan, gray, green or brown) and/or eschar (tan, brown or black) in the wound bed.  Wound Description (Comments):   Present on Admission: Yes  Dressing Type Foam - Lift dressing to assess site every shift 11/14/22 0025     DVT prophylaxis: scd Family Communication:daughter Status is: Inpatient due to acute GI bleed     Code Status:     Code Status Orders  (From admission, onward)           Start     Ordered   11/12/22 1324  Do not attempt resuscitation (DNR)- Limited -Do Not Intubate (DNI)  (Code Status)  Continuous       Question Answer Comment  If pulseless and not breathing No CPR or chest compressions.   In Pre-Arrest Conditions (Patient Is Breathing and Has A Pulse) Do not intubate. Provide all appropriate non-invasive medical interventions. Avoid ICU transfer unless indicated or required.   Consent: Discussion documented in EHR or advanced directives reviewed      11/12/22 1323           Code Status History  Date Active Date Inactive Code Status Order ID Comments User Context   11/11/2022 1901 11/12/2022 1323 Full Code [id number]  Briscoe Deutscher, MD ED         IV Access:   Peripheral IV   Procedures and diagnostic studies:   No results found.   Medical Consultants:   None.   Subjective:    Amara Lahey bit this morning but no new complaints when I woke her up.  Objective:    Vitals:   11/13/22 1237 11/13/22 2019 11/14/22 0355 11/14/22  0500  BP: 129/65 121/60 (!) 108/51   Pulse: 90 88 78   Resp: 18 18 18    Temp: 98.4 F (36.9 C) 98.4 F (36.9 C) 98.3 F (36.8 C)   TempSrc: Oral Oral Oral   SpO2: 100% 100% 100%   Weight:    51.7 kg  Height:       SpO2: 100 %   Intake/Output Summary (Last 24 hours) at 11/14/2022 1009 Last data filed at 11/14/2022 0549 Gross per 24 hour  Intake 537.1 ml  Output 600 ml  Net -62.9 ml   Filed Weights   11/12/22 0500 11/13/22 0500 11/14/22 0500  Weight: 53 kg 51.8 kg 51.7 kg    Exam: General exam: In no acute distress. Respiratory system: Good air movement and clear to auscultation. Cardiovascular system: S1 & S2 heard, RRR. No JVD. Gastrointestinal system: Abdomen is nondistended, soft and nontender.  Extremities: No pedal edema. Skin: No rashes, lesions or ulcers Psychiatry: No judgment or insight in medical condition.  Data Reviewed:    Labs: Basic Metabolic Panel: Recent Labs  Lab 11/11/22 1619 11/12/22 0429 11/13/22 0414  NA 142 142 137  K 3.3* 3.5 4.2  CL 114* 116* 112*  CO2 19* 18* 17*  GLUCOSE 117* 108* 95  BUN 57* 42* 25*  CREATININE 0.87 1.02* 0.84  CALCIUM 8.1* 8.6* 8.2*  MG  --  2.3 2.1  PHOS  --   --  3.4   GFR Estimated Creatinine Clearance: 42.1 mL/min (by C-G formula based on SCr of 0.84 mg/dL). Liver Function Tests: Recent Labs  Lab 11/11/22 1619 11/13/22 0414  AST 19  --   ALT 12  --   ALKPHOS 58  --   BILITOT 0.4  --   PROT 7.6  --   ALBUMIN 2.8* 2.4*   No results for input(s): "LIPASE", "AMYLASE" in the last 168 hours. No results for input(s): "AMMONIA" in the last 168 hours. Coagulation profile No results for input(s): "INR", "PROTIME" in the last 168 hours. COVID-19 Labs  Recent Labs    11/12/22 0429  FERRITIN 520*    No results found for: "SARSCOV2NAA"  CBC: Recent Labs  Lab 11/11/22 1619 11/12/22 0429 11/12/22 1211 11/12/22 1927 11/13/22 0414 11/13/22 1148 11/13/22 2049 11/14/22 0408  WBC 6.2 5.5  --   --   5.4  --   --   --   HGB 5.5* 4.3*   < > 5.0* 5.0* 5.7* 5.2* 5.2*  HCT 17.6* 13.9*   < > 16.5* 16.7* 19.0* 16.8* 17.0*  MCV 93.1 95.2  --   --  95.4  --   --   --   PLT 275 246  --   --  239  --   --   --    < > = values in this interval not displayed.   Cardiac Enzymes: No results for input(s): "CKTOTAL", "CKMB", "CKMBINDEX", "TROPONINI" in the last 168 hours. BNP (last  3 results) No results for input(s): "PROBNP" in the last 8760 hours. CBG: No results for input(s): "GLUCAP" in the last 168 hours. D-Dimer: No results for input(s): "DDIMER" in the last 72 hours. Hgb A1c: No results for input(s): "HGBA1C" in the last 72 hours. Lipid Profile: No results for input(s): "CHOL", "HDL", "LDLCALC", "TRIG", "CHOLHDL", "LDLDIRECT" in the last 72 hours. Thyroid function studies: No results for input(s): "TSH", "T4TOTAL", "T3FREE", "THYROIDAB" in the last 72 hours.  Invalid input(s): "FREET3" Anemia work up: Recent Labs    11/12/22 0429  VITAMINB12 136*  FOLATE 13.7  FERRITIN 520*  TIBC 182*  IRON 16*  RETICCTPCT 3.0   Sepsis Labs: Recent Labs  Lab 11/11/22 1619 11/12/22 0429 11/13/22 0414  WBC 6.2 5.5 5.4   Microbiology Recent Results (from the past 240 hour(s))  MRSA Next Gen by PCR, Nasal     Status: None   Collection Time: 11/12/22  7:03 AM   Specimen: Nasal Mucosa; Nasal Swab  Result Value Ref Range Status   MRSA by PCR Next Gen NOT DETECTED NOT DETECTED Final    Comment: (NOTE) The GeneXpert MRSA Assay (FDA approved for NASAL specimens only), is one component of a comprehensive MRSA colonization surveillance program. It is not intended to diagnose MRSA infection nor to guide or monitor treatment for MRSA infections. Test performance is not FDA approved in patients less than 18 years old. Performed at [facility], [address] [place], [place] [address]      Medications:    vitamin B-12  1,000 mcg Oral Daily   leptospermum manuka honey   1 Application Topical Daily   melatonin  3 mg Oral QHS   [START ON 11/15/2022] pantoprazole  40 mg Intravenous Q12H   sodium chloride flush  3 mL Intravenous Q12H   Continuous Infusions:  pantoprazole 8 mg/hr (11/14/22 0249)      LOS: 3 days   Marinda Elk  [facility]  11/14/2022, 10:09 AM

## 2022-11-15 DIAGNOSIS — K922 Gastrointestinal hemorrhage, unspecified: Secondary | ICD-10-CM | POA: Diagnosis not present

## 2022-11-15 DIAGNOSIS — E860 Dehydration: Secondary | ICD-10-CM | POA: Diagnosis not present

## 2022-11-15 DIAGNOSIS — D509 Iron deficiency anemia, unspecified: Secondary | ICD-10-CM | POA: Diagnosis not present

## 2022-11-15 LAB — HEMOGLOBIN AND HEMATOCRIT, BLOOD
HCT: 16 % — ABNORMAL LOW (ref 36.0–46.0)
HCT: 16.4 % — ABNORMAL LOW (ref 36.0–46.0)
HCT: 19.3 % — ABNORMAL LOW (ref 36.0–46.0)
Hemoglobin: 5.1 g/dL — CL (ref 12.0–15.0)
Hemoglobin: 5.1 g/dL — CL (ref 12.0–15.0)
Hemoglobin: 5.9 g/dL — CL (ref 12.0–15.0)

## 2022-11-15 MED ORDER — PANTOPRAZOLE SODIUM 40 MG PO TBEC
40.0000 mg | DELAYED_RELEASE_TABLET | Freq: Two times a day (BID) | ORAL | Status: DC
  Administered 2022-11-15 – 2022-11-17 (×5): 40 mg via ORAL
  Filled 2022-11-15 (×5): qty 1

## 2022-11-15 MED ORDER — FOLIC ACID 1 MG PO TABS
1.0000 mg | ORAL_TABLET | Freq: Every day | ORAL | Status: DC
  Administered 2022-11-15 – 2022-11-17 (×3): 1 mg via ORAL
  Filled 2022-11-15 (×3): qty 1

## 2022-11-15 MED ORDER — ORAL CARE MOUTH RINSE: 15.0000 mL | OROMUCOSAL | Status: DC | PRN

## 2022-11-15 MED ORDER — POLYETHYLENE GLYCOL 3350 17 G PO PACK
17.0000 g | PACK | Freq: Every day | ORAL | Status: DC
  Administered 2022-11-15 – 2022-11-16 (×2): 17 g via ORAL
  Filled 2022-11-15 (×2): qty 1

## 2022-11-15 MED ORDER — FERROUS SULFATE 325 (65 FE) MG PO TABS
325.0000 mg | ORAL_TABLET | Freq: Every day | ORAL | Status: DC
  Administered 2022-11-16 – 2022-11-17 (×2): 325 mg via ORAL
  Filled 2022-11-15 (×2): qty 1

## 2022-11-15 NOTE — Progress Notes (Signed)
Daily Progress Note   Patient Name: Kathleen Dorsey       Date: 11/15/2022 DOB: [birth year]  Age: 82 y.o. MRN#: [id number] Attending Physician: Marinda Elk, MD Primary Care Physician: Patient, No Pcp Per Admit Date: 11/11/2022  Reason for Consultation/Follow-up: Establishing goals of care  Subjective:  Resting in bed.   Length of Stay: 4  Current Medications: Scheduled Meds:   vitamin B-12  1,000 mcg Oral Daily   [START ON 11/16/2022] ferrous sulfate  325 mg Oral Q breakfast   folic acid  1 mg Oral Daily   leptospermum manuka honey  1 Application Topical Daily   melatonin  3 mg Oral QHS   pantoprazole  40 mg Oral BID   polyethylene glycol  17 g Oral Daily   sodium chloride flush  3 mL Intravenous Q12H    Continuous Infusions:   PRN Meds: acetaminophen **OR** acetaminophen, haloperidol lactate, ondansetron **OR** ondansetron (ZOFRAN) IV, mouth rinse  Physical Exam         Awake alert No distress No edema Regular work of breathing  Vital Signs: BP (!) 106/59 (BP Location: Left Arm)   Pulse 91   Temp 97.9 F (36.6 C) (Oral)   Resp 15   Ht 5\' 3"  (1.6 m)   Wt 52.3 kg   SpO2 100%   BMI 20.42 kg/m  SpO2: SpO2: 100 % O2 Device: O2 Device: Room Air O2 Flow Rate:    Intake/output summary:  Intake/Output Summary (Last 24 hours) at 11/15/2022 1151 Last data filed at 11/15/2022 0845 Gross per 24 hour  Intake 394.29 ml  Output 300 ml  Net 94.29 ml   LBM: Last BM Date : 11/14/22 Baseline Weight: Weight: 59 kg Most recent weight: Weight: 52.3 kg       Palliative Assessment/Data:      Patient Active Problem List   Diagnosis Date Noted   Acute GI bleeding 11/11/2022   Acute blood loss anemia 11/11/2022   Dementia (HCC) 11/11/2022   Failure to thrive in adult  09/10/2022   Thrombocytopenia (HCC) 11/05/2021   Memory loss 07/12/2019   Hyperuricemia 10/05/2013   Pancytopenia (HCC) 10/05/2013   Hypertension, benign 04/23/2013    Palliative Care Assessment & Plan   Patient Profile:    Assessment:  62-year-old lady with dementia normocytic anemia essential hypertension anticoagulation,  refusal of blood products due to religious beliefs, brought into the emergency department because of bleeding and low blood pressure. Hemoglobin on admission 5.5 g/dL. Hemoccult positive. GI consulted. Eliquis was discontinued. Patient is a Scientist, product/process development. GI performed rectal exam that showed melenic stool. Patient started on IV Protonix drip, given IV iron. Started on clear liquids. She continues on IV PPI. Remains admitted to hospital medicine service. Palliative consult for ongoing goals of care discussions.   Recommendations/Plan:  Call placed and discussed with daughter Cardell Peach Amparo in detail, discussed about her current condition and overall prognosis - introduced hospice philosophy of care and daughter is in agreement. Discussed with TOC and [facility] MD.  Continue current mode of care.     Code Status:    Code Status Orders  (From admission, onward)           Start     Ordered   11/12/22 1324  Do not attempt resuscitation (DNR)- Limited -Do Not Intubate (DNI)  (Code Status)  Continuous       Question Answer Comment  If pulseless and not breathing No CPR or chest compressions.   In Pre-Arrest Conditions (Patient Is Breathing and Has A Pulse) Do not intubate. Provide all appropriate non-invasive medical interventions. Avoid ICU transfer unless indicated or required.   Consent: Discussion documented in EHR or advanced directives reviewed      11/12/22 1323           Code Status History     Date Active Date Inactive Code Status Order ID Comments User Context   11/11/2022 1901 11/12/2022 1323 Full Code [id number]  Briscoe Deutscher, MD ED        Prognosis:  < 6 months  Discharge Planning: Back to [facility]  facility with hospice.   Care plan was discussed with daughter Clester Cisek on the phone.    Thank you for allowing the Palliative Medicine Team to assist in the care of this patient.  Mod MDM.      Greater than 50%  of this time was spent counseling and coordinating care related to the above assessment and plan.  Rosalin Hawking, MD  Please contact Palliative Medicine Team phone at [phone] for questions and concerns.

## 2022-11-15 NOTE — Plan of Care (Signed)
  Problem: Nutrition: Goal: Adequate nutrition will be maintained Outcome: Progressing   Problem: Elimination: Goal: Will not experience complications related to bowel motility Outcome: Progressing   Problem: Pain Managment: Goal: General experience of comfort will improve Outcome: Progressing   

## 2022-11-15 NOTE — Care Management CC44 (Signed)
Condition Code 44 Documentation Completed  Patient Details  Name: Florence Tritto MRN: [id number] Date of Birth: Dec 31, 1940   Condition Code 44 given:  Yes Patient signature on Condition Code 44 notice:  Yes Documentation of 2 MD's agreement:  Yes Code 44 added to claim:  Yes    Larrie Kass, LCSW 11/15/2022, 9:39 AM

## 2022-11-15 NOTE — Care Management Obs Status (Signed)
MEDICARE OBSERVATION STATUS NOTIFICATION   Patient Details  Name: Kathleen Dorsey MRN: [id number] Date of Birth: 29-Nov-1940   Medicare Observation Status Notification Given:  Yes    Larrie Kass, LCSW 11/15/2022, 9:40 AM

## 2022-11-15 NOTE — Progress Notes (Signed)
[facility] PROGRESS NOTE    Progress Note  Kathleen Dorsey  ZOX:[id number] DOB: Dec 19, 1940 DOA: 11/11/2022 PCP: Patient, No Pcp Per     Brief Narrative:   Kathleen Dorsey is an 82 y.o. female past medical history of dementia, normocytic anemia essential hypertension, anticoagulation with an unknown indication refusal of blood products due to religious believes brought into the ED for bleeding and low blood pressure hemoglobin admission was 5.5 (on 6/24 was 9.7).  Hemoccult was positive   Assessment/Plan:   Acute GI bleeding/acute blood loss anemia likely due to GI loss: Hemoccult positive, patient is a Jehovah's Witness refuses blood products. Eliquis was discontinued.  She is no longer a candidate for anticoagulation. Change Protonix to twice a day, she status post IV iron. Daughter agreed to IV Aranesp.  She has had no further bleeding. Hemoglobin continues to be around 5 patient is currently asymptomatic. Placed on right diet. In light of the situation GI has signed off. We have consulted palliative care to address goals of care as she will rebleed at some point in the future. No melanotic stools per nurse. Was started on ferrous sulfate.  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 Family to be at bedside and reorient.  History of gout: Stable.  Elevated blood pressure: Without a diagnosis of hypertension.  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 I had a conversation with the daughter and she confirmed this. GI was consulted, cannot perform EGD as hemoglobin is less than 7.  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 (Active)  11/11/22 2102  Location: Coccyx  Location Orientation: Medial  Staging: Unstageable - Full thickness tissue loss in which the base of the injury is covered by slough (yellow, tan, gray, green or brown) and/or eschar (tan, brown or black) in the wound bed.  Wound Description (Comments):   Present on Admission: Yes  Dressing Type Foam - Lift dressing to assess site every shift;Gauze (Comment) 11/15/22 0559     DVT prophylaxis: scd Family Communication:daughter Status is: Inpatient due to acute GI bleed     Code Status:     Code Status Orders  (From admission, onward)           Start     Ordered   11/12/22 1324  Do not attempt resuscitation (DNR)- Limited -Do Not Intubate (DNI)  (Code Status)  Continuous       Question Answer Comment  If pulseless and not breathing No CPR or chest compressions.   In Pre-Arrest Conditions (Patient Is Breathing and Has A Pulse) Do not intubate. Provide all appropriate non-invasive medical interventions. Avoid ICU transfer unless indicated or required.   Consent: Discussion documented in EHR or advanced directives reviewed      11/12/22 1323           Code Status History     Date Active Date Inactive Code Status Order ID Comments User Context   11/11/2022 1901 11/12/2022 1323 Full Code [id number]  Briscoe Deutscher, MD ED         IV Access:   Peripheral IV  Procedures and diagnostic studies:   No results found.   Medical Consultants:   None.   Subjective:    Kathleen Dorsey sleep this morning but no further melanotic stools per nurse.  Objective:    Vitals:   11/14/22 1318 11/14/22 2009 11/15/22 0500 11/15/22 0548  BP: 123/63 138/76  (!) 106/59  Pulse: 94 88  91  Resp: 20 15    Temp: (!) 97.4 F (36.3 C) 99.2 F (37.3 C)  97.9 F (36.6 C)  TempSrc: Oral Oral  Oral  SpO2: 100% 100%   100%  Weight:   52.3 kg   Height:       SpO2: 100 %   Intake/Output Summary (Last 24 hours) at 11/15/2022 1010 Last data filed at 11/15/2022 0845 Gross per 24 hour  Intake 754.29 ml  Output 300 ml  Net 454.29 ml   Filed Weights   11/13/22 0500 11/14/22 0500 11/15/22 0500  Weight: 51.8 kg 51.7 kg 52.3 kg    Exam: General exam: In no acute distress. Respiratory system: Good air movement and clear to auscultation. Cardiovascular system: S1 & S2 heard, RRR. No JVD. Gastrointestinal system: Abdomen is nondistended, soft and nontender.  Extremities: No pedal edema. Skin: No rashes, lesions or ulcers Psychiatry: Judgement and insight appear normal. Mood & affect appropriate. Data Reviewed:    Labs: Basic Metabolic Panel: Recent Labs  Lab 11/11/22 1619 11/12/22 0429 11/13/22 0414  NA 142 142 137  K 3.3* 3.5 4.2  CL 114* 116* 112*  CO2 19* 18* 17*  GLUCOSE 117* 108* 95  BUN 57* 42* 25*  CREATININE 0.87 1.02* 0.84  CALCIUM 8.1* 8.6* 8.2*  MG  --  2.3 2.1  PHOS  --   --  3.4   GFR Estimated Creatinine Clearance: 42.6 mL/min (by C-G formula based on SCr of 0.84 mg/dL). Liver Function Tests: Recent Labs  Lab 11/11/22 1619 11/13/22 0414  AST 19  --   ALT 12  --   ALKPHOS 58  --   BILITOT 0.4  --   PROT 7.6  --   ALBUMIN 2.8* 2.4*   No results for input(s): "LIPASE", "AMYLASE" in the last 168 hours. No results for input(s): "AMMONIA" in the last 168 hours. Coagulation profile No results for input(s): "INR", "PROTIME" in the last 168 hours. COVID-19 Labs  No results for input(s): "DDIMER", "FERRITIN", "LDH", "CRP" in the last 72 hours.   No results found for: "SARSCOV2NAA"  CBC: Recent Labs  Lab 11/11/22 1619 11/12/22 0429 11/12/22 1211 11/13/22 0414 11/13/22 1148 11/13/22 2049 11/14/22 0408 11/14/22 1158 11/14/22 1915 11/15/22 0413  WBC 6.2 5.5  --  5.4  --   --   --   --   --   --   HGB 5.5* 4.3*   < > 5.0*   < > 5.2* 5.2* 5.6* 5.4* 5.1*  HCT  17.6* 13.9*   < > 16.7*   < > 16.8* 17.0* 18.6* 17.7* 16.0*  MCV 93.1 95.2  --  95.4  --   --   --   --   --   --   PLT 275 246  --  239  --   --   --   --   --   --    < > = values in this interval not displayed.   Cardiac Enzymes: No results for input(s): "CKTOTAL", "CKMB", "CKMBINDEX", "TROPONINI" in the last 168 hours. BNP (last 3 results) No results for input(s): "PROBNP" in  the last 8760 hours. CBG: No results for input(s): "GLUCAP" in the last 168 hours. D-Dimer: No results for input(s): "DDIMER" in the last 72 hours. Hgb A1c: No results for input(s): "HGBA1C" in the last 72 hours. Lipid Profile: No results for input(s): "CHOL", "HDL", "LDLCALC", "TRIG", "CHOLHDL", "LDLDIRECT" in the last 72 hours. Thyroid function studies: No results for input(s): "TSH", "T4TOTAL", "T3FREE", "THYROIDAB" in the last 72 hours.  Invalid input(s): "FREET3" Anemia work up: No results for input(s): "VITAMINB12", "FOLATE", "FERRITIN", "TIBC", "IRON", "RETICCTPCT" in the last 72 hours.  Sepsis Labs: Recent Labs  Lab 11/11/22 1619 11/12/22 0429 11/13/22 0414  WBC 6.2 5.5 5.4   Microbiology Recent Results (from the past 240 hour(s))  MRSA Next Gen by PCR, Nasal     Status: None   Collection Time: 11/12/22  7:03 AM   Specimen: Nasal Mucosa; Nasal Swab  Result Value Ref Range Status   MRSA by PCR Next Gen NOT DETECTED NOT DETECTED Final    Comment: (NOTE) The GeneXpert MRSA Assay (FDA approved for NASAL specimens only), is one component of a comprehensive MRSA colonization surveillance program. It is not intended to diagnose MRSA infection nor to guide or monitor treatment for MRSA infections. Test performance is not FDA approved in patients less than 31 years old. Performed at [facility], [address] [place], [place] [address]      Medications:    vitamin B-12  1,000 mcg Oral Daily   leptospermum manuka honey  1 Application Topical Daily   melatonin  3 mg  Oral QHS   pantoprazole  40 mg Intravenous Q12H   sodium chloride flush  3 mL Intravenous Q12H   Continuous Infusions:      LOS: 4 days   Marinda Elk  [facility]  11/15/2022, 10:10 AM

## 2022-11-15 NOTE — TOC Progression Note (Signed)
Transition of Care [facility]) - Progression Note    Patient Details  Name: Maydelle Kindler MRN: [id number] Date of Birth: [birth year]  [facility]) CM/SW Contact  Larrie Kass, LCSW Phone Number: 11/15/2022, 11:53 AM  Clinical Narrative:    CSW spoke with pt's daughter Sabino Dick to discuss hospice rec. Pt's daughter has agreed for referral to be sent to [facility]. Referral sent to hospice Liaison [facility].  CSW spoke to [place] with [employer], she reports if pt to d/c tomorrow, will need to receive the d/c summary by 10 am to return. TOC to follow.      Expected Discharge Plan: Long Term Nursing Home (faciltiy with hospice) Barriers to Discharge: Continued Medical Work up  Expected Discharge Plan and Services In-house Referral: Clinical Social Work     Living arrangements for the past 2 months: Skilled Nursing Facility                                       Social Determinants of Health (SDOH) Interventions SDOH Screenings   Food Insecurity: No Food Insecurity (11/11/2022)  Housing: Low Risk  (11/11/2022)  Transportation Needs: No Transportation Needs (11/11/2022)  Utilities: Not At Risk (11/11/2022)  Tobacco Use: High Risk (11/13/2022)    Readmission Risk Interventions     No data to display

## 2022-11-16 DIAGNOSIS — K922 Gastrointestinal hemorrhage, unspecified: Secondary | ICD-10-CM | POA: Diagnosis not present

## 2022-11-16 DIAGNOSIS — D509 Iron deficiency anemia, unspecified: Secondary | ICD-10-CM | POA: Diagnosis not present

## 2022-11-16 DIAGNOSIS — E860 Dehydration: Secondary | ICD-10-CM | POA: Diagnosis not present

## 2022-11-16 LAB — HEMOGLOBIN AND HEMATOCRIT, BLOOD
HCT: 17.3 % — ABNORMAL LOW (ref 36.0–46.0)
Hemoglobin: 5.5 g/dL — CL (ref 12.0–15.0)

## 2022-11-16 MED ORDER — FERROUS SULFATE 325 (65 FE) MG PO TABS: 325.0000 mg | ORAL_TABLET | Freq: Every day | ORAL | 0 refills | Status: AC

## 2022-11-16 MED ORDER — PANTOPRAZOLE SODIUM 40 MG PO TBEC: 40.0000 mg | DELAYED_RELEASE_TABLET | Freq: Two times a day (BID) | ORAL | 3 refills | Status: AC

## 2022-11-16 NOTE — Progress Notes (Signed)
PMT no charge note.   Chart reviewed, patient seen by [facility] liaison, to be discharged back to her facility with hospice services.  No further in patient PMT specific recommendations at this time.  No charge Rosalin Hawking MD [place] palliative.

## 2022-11-16 NOTE — Progress Notes (Addendum)
[facility] Liaison Note   Plan is for patient to be discharged with [facility] services.  Please contact [facility] liaison prior to discharge.    Please call with any questions or concerns. Thank you  Eugenie Birks, MSW [facility] Liaison [phone]

## 2022-11-16 NOTE — Discharge Summary (Signed)
Physician Discharge Summary  Kathleen Dorsey ZDG:[id number] DOB: 12/04/1940 DOA: 11/11/2022  PCP: Patient, No Pcp Per  Admit date: 11/11/2022 Discharge date: 11/16/2022  Admitted From: Home Disposition:  Home  Recommendations for Outpatient Follow-up:  Follow up with PCP in 1-2 weeks Please obtain BMP/CBC in one week   Home Health:nO Equipment/Devices:nONE  Discharge Condition:Hospice CODE STATUS:DNR Diet recommendation: Heart Healthy   Brief/Interim Summary: 82 y.o. female past medical history of dementia, normocytic anemia essential hypertension, anticoagulation with an unknown indication refusal of blood products due to religious believes brought into the ED for bleeding and low blood pressure hemoglobin admission was 5.5 (on 6/24 was 9.7).  Hemoccult was positive   Discharge Diagnoses:  Principal Problem:   Acute GI bleeding Active Problems:   Acute blood loss anemia   Dementia (HCC)   Hypertension, benign  Acute GI bleed/acute blood loss anemia: Hemoccult was positive patient is a Jehovah's Witness and refuses blood product. Eliquis was discontinued she is no longer a candidate for anticoagulation. She was started on Protonix IV twice a day ferritin was low she status post IV iron. Gavin Pound agreed to receive Aranesp. She was monitored for 2 days had no further bleeding. Her hemoglobin has remained stable at 5. She was placed on a diet which she tolerated. GI was consulted in light of the situation they have signed off at the proceed with a hemoglobin of 5 for EGD this was expressed to the daughter and she understands. She had no further melanotic stools. She is to avoid all NSAIDs including topical as an outpatient. This is explained to the daughter.  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  History of gout: Stable.  Elevated blood pressure without a diagnosis of hypertension: Noted.  Goals of care: Patient has advanced dementia, she is a TEFL teacher Witness  patient and family are adamant about blood products due to religious belief which we respect they have refused blood transfusions. I had a conversation with the daughter and she confirmed this. GI was consulted, cannot perform EGD as hemoglobin is less than 7. She was made a DNR/DNI, and hospice will follow-up as an outpatient the daughter wishes for the patient to stay out of the hospital.   Discharge Instructions  Discharge Instructions     Diet - low sodium heart healthy   Complete by: As directed    Increase activity slowly   Complete by: As directed    No wound care   Complete by: As directed       Allergies as of 11/16/2022       Reactions   Nitrous Oxide    unknown   Iodinated Contrast Media Itching, Rash   Penicillins Rash        Medication List     STOP taking these medications    diclofenac Sodium 1 % Gel Commonly known as: VOLTAREN       TAKE these medications    allopurinol 100 MG tablet Commonly known as: ZYLOPRIM Take 100 mg by mouth daily.   ferrous sulfate 325 (65 FE) MG tablet Take 1 tablet (325 mg total) by mouth daily with breakfast. Start taking on: November 17, 2022   folic acid 1 MG tablet Commonly known as: FOLVITE Take 1 tablet (1 mg total) by mouth daily.   mirtazapine 15 MG disintegrating tablet Commonly known as: REMERON SOL-TAB Take 15 mg by mouth at bedtime.   pantoprazole 40 MG tablet Commonly known as: PROTONIX Take 1 tablet (40 mg total) by mouth 2 (two)  times daily.   rosuvastatin 5 MG tablet Commonly known as: CRESTOR Take 5 mg by mouth daily.   senna-docusate 8.6-50 MG tablet Commonly known as: Senokot-S Take 2 tablets by mouth at bedtime.   thiamine 100 MG tablet Commonly known as: Vitamin B-1 Take 1 tablet (100 mg total) by mouth daily.   traMADol 50 MG tablet Commonly known as: ULTRAM Take 50 mg by mouth every 6 (six) hours as needed for moderate pain.   Vitamin D (Ergocalciferol) 1.25 MG (50000 UNIT)  Caps capsule Commonly known as: DRISDOL Take 50,000 Units by mouth every 7 (seven) days.        Allergies  Allergen Reactions   Nitrous Oxide     unknown   Iodinated Contrast Media Itching and Rash   Penicillins Rash    Consultations: Gastroenterology Palliative care   Procedures/Studies: US PELVIS (TRANSABDOMINAL ONLY)  Result Date: 11/11/2022 CLINICAL DATA:  Postmenopausal bleeding EXAM: TRANSABDOMINAL ULTRASOUND OF PELVIS TECHNIQUE: Transabdominal ultrasound examination of the pelvis was performed including evaluation of the uterus, ovaries, adnexal regions, and pelvic cul-de-sac. COMPARISON:  CT abdomen and pelvis 11/02/2021 FINDINGS: Uterus Hysterectomy. Right ovary Not visualized. Left ovary Not visualized. Other findings: No abnormal free fluid. Unremarkable appearance of the bladder. 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  The ovaries were not visualized. Electronically Signed   By: Minerva Fester M.D.   On: 11/11/2022 18:05   (Echo, Carotid, EGD, Colonoscopy, ERCP)    Subjective: No complaints  Discharge Exam: Vitals:   11/15/22 2026 11/16/22 0415  BP: 125/60 (!) 103/56  Pulse: 90 86  Resp:  14  Temp: 98.9 F (37.2 C) 97.7 F (36.5 C)  SpO2: 100% 100%   Vitals:   11/15/22 0548 11/15/22 1300 11/15/22 2026 11/16/22 0415  BP: (!) 106/59 126/82 125/60 (!) 103/56  Pulse: 91 90 90 86  Resp:  20  14  Temp: 97.9 F (36.6 C) 98 F (36.7 C) 98.9 F (37.2 C) 97.7 F (36.5 C)  TempSrc: Oral Oral Oral Oral  SpO2: 100% 100% 100% 100%  Weight:      Height:        General: Pt is alert, awake, not in acute distress Cardiovascular: RRR, S1/S2 +, no rubs, no gallops Respiratory: CTA bilaterally, no wheezing, no rhonchi Abdominal: Soft, NT, ND, bowel sounds + Extremities: no edema, no cyanosis    The results of significant diagnostics from this hospitalization (including imaging, microbiology, ancillary and laboratory) are listed below for reference.      Microbiology: Recent Results (from the past 240 hour(s))  MRSA Next Gen by PCR, Nasal     Status: None   Collection Time: 11/12/22  7:03 AM   Specimen: Nasal Mucosa; Nasal Swab  Result Value Ref Range Status   MRSA by PCR Next Gen NOT DETECTED NOT DETECTED Final    Comment: (NOTE) The GeneXpert MRSA Assay (FDA approved for NASAL specimens only), is one component of a comprehensive MRSA colonization surveillance program. It is not intended to diagnose MRSA infection nor to guide or monitor treatment for MRSA infections. Test performance is not FDA approved in patients less than 46 years old. Performed at [facility], [address] [place], [place] [address]      Labs: BNP (last 3 results) No results for input(s): "BNP" in the last 8760 hours. Basic Metabolic Panel: Recent Labs  Lab 11/11/22 1619 11/12/22 0429 11/13/22 0414  NA 142 142 137  K 3.3* 3.5 4.2  CL 114* 116* 112*  CO2 19* 18* 17*  GLUCOSE 117* 108* 95  BUN 57* 42* 25*  CREATININE 0.87 1.02* 0.84  CALCIUM 8.1* 8.6* 8.2*  MG  --  2.3 2.1  PHOS  --   --  3.4   Liver Function Tests: Recent Labs  Lab 11/11/22 1619 11/13/22 0414  AST 19  --   ALT 12  --   ALKPHOS 58  --   BILITOT 0.4  --   PROT 7.6  --   ALBUMIN 2.8* 2.4*   No results for input(s): "LIPASE", "AMYLASE" in the last 168 hours. No results for input(s): "AMMONIA" in the last 168 hours. CBC: Recent Labs  Lab 11/11/22 1619 11/12/22 0429 11/12/22 1211 11/13/22 0414 11/13/22 1148 11/14/22 1915 11/15/22 0413 11/15/22 1145 11/15/22 1957 11/16/22 0358  WBC 6.2 5.5  --  5.4  --   --   --   --   --   --   HGB 5.5* 4.3*   < > 5.0*   < > 5.4* 5.1* 5.1* 5.9* 5.5*  HCT 17.6* 13.9*   < > 16.7*   < > 17.7* 16.0* 16.4* 19.3* 17.3*  MCV 93.1 95.2  --  95.4  --   --   --   --   --   --   PLT 275 246  --  239  --   --   --   --   --   --    < > = values in this interval not displayed.   Cardiac Enzymes: No results for  input(s): "CKTOTAL", "CKMB", "CKMBINDEX", "TROPONINI" in the last 168 hours. BNP: Invalid input(s): "POCBNP" CBG: No results for input(s): "GLUCAP" in the last 168 hours. D-Dimer No results for input(s): "DDIMER" in the last 72 hours. Hgb A1c No results for input(s): "HGBA1C" in the last 72 hours. Lipid Profile No results for input(s): "CHOL", "HDL", "LDLCALC", "TRIG", "CHOLHDL", "LDLDIRECT" in the last 72 hours. Thyroid function studies No results for input(s): "TSH", "T4TOTAL", "T3FREE", "THYROIDAB" in the last 72 hours.  Invalid input(s): "FREET3" Anemia work up No results for input(s): "VITAMINB12", "FOLATE", "FERRITIN", "TIBC", "IRON", "RETICCTPCT" in the last 72 hours. Urinalysis    Component Value Date/Time   COLORURINE YELLOW 12/04/2018 2010   APPEARANCEUR CLEAR 12/04/2018 2010   LABSPEC 1.025 12/04/2018 2010   PHURINE 5.5 12/04/2018 2010   GLUCOSEU NEGATIVE 12/04/2018 2010   HGBUR NEGATIVE 12/04/2018 2010   BILIRUBINUR MODERATE (A) 12/04/2018 2010   KETONESUR 15 (A) 12/04/2018 2010   PROTEINUR NEGATIVE 12/04/2018 2010   NITRITE NEGATIVE 12/04/2018 2010   LEUKOCYTESUR NEGATIVE 12/04/2018 2010   Sepsis Labs Recent Labs  Lab 11/11/22 1619 11/12/22 0429 11/13/22 0414  WBC 6.2 5.5 5.4   Microbiology Recent Results (from the past 240 hour(s))  MRSA Next Gen by PCR, Nasal     Status: None   Collection Time: 11/12/22  7:03 AM   Specimen: Nasal Mucosa; Nasal Swab  Result Value Ref Range Status   MRSA by PCR Next Gen NOT DETECTED NOT DETECTED Final    Comment: (NOTE) The GeneXpert MRSA Assay (FDA approved for NASAL specimens only), is one component of a comprehensive MRSA colonization surveillance program. It is not intended to diagnose MRSA infection nor to guide or monitor treatment for MRSA infections. Test performance is not FDA approved in patients less than 59 years old. Performed at [facility], [address], [place], [place]  [address]      Time coordinating discharge: Over 34  minutes  SIGNED:   Marinda Elk, MD  [facility] 11/16/2022, 10:49 AM Pager   If 7PM-7AM, please contact night-coverage [email or web address] Password [email or web address]

## 2022-11-17 NOTE — TOC Transition Note (Signed)
Transition of Care [facility]) - CM/SW Discharge Note   Patient Details  Name: Kathleen Dorsey MRN: [id number] Date of Birth: 10-10-1940  [facility]) CM/SW Contact:  Georgie Chard, LCSW Phone Number: 11/17/2022, 12:10 PM   Clinical Narrative:    PTAR called. Patient was DC back to facility room 418 report number [phone]. This CSW did inform daughter that patient will DC back to facility. There are no further TOC needs at this time.      Barriers to Discharge: Continued Medical Work up   Patient Goals and CMS Choice CMS Medicare.gov Compare Post Acute Care list provided to:: Patient Represenative (must comment) Choice offered to / list presented to : Adult Children  Discharge Placement                         Discharge Plan and Services Additional resources added to the After Visit Summary for   In-house Referral: Clinical Social Work                                   Social Determinants of Health (SDOH) Interventions SDOH Screenings   Food Insecurity: No Food Insecurity (11/11/2022)  Housing: Low Risk  (11/11/2022)  Transportation Needs: No Transportation Needs (11/11/2022)  Utilities: Not At Risk (11/11/2022)  Tobacco Use: High Risk (11/13/2022)     Readmission Risk Interventions     No data to display

## 2022-11-17 NOTE — Progress Notes (Signed)
AVS given to PTAR and explained over the phone with the receiving nurse at [facility]. Medications and follow up appointments have been explained with pt's nurse verbalizing understanding.

## 2022-11-17 NOTE — Progress Notes (Signed)
Patient was discharged yesterday, on 11/16/2022.  Seems she stayed overnight due to logistic issues.  No significant events overnight of this morning.  Discharge summary from 9/7 stands.
